# Patient Record
Sex: Male | Born: 1985 | Race: White | Hispanic: No | Marital: Married | Smoking: Never smoker
Health system: Southern US, Community
[De-identification: ages and names within clinical notes are randomized; demographics above are authoritative.]

## PROBLEM LIST (undated history)

## (undated) DIAGNOSIS — E785 Hyperlipidemia, unspecified: Secondary | ICD-10-CM

## (undated) HISTORY — PX: ROTATOR CUFF REPAIR: SHX139

## (undated) HISTORY — PX: CLAVICLE SURGERY: SHX598

## (undated) HISTORY — DX: Hyperlipidemia, unspecified: E78.5

---
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
## 2004-04-22 ENCOUNTER — Emergency Department (HOSPITAL_COMMUNITY): Admission: EM | Admit: 2004-04-22 | Discharge: 2004-04-23 | Payer: Self-pay | Admitting: Emergency Medicine

## 2004-05-13 ENCOUNTER — Emergency Department (HOSPITAL_COMMUNITY): Admission: EM | Admit: 2004-05-13 | Discharge: 2004-05-13 | Payer: Self-pay | Admitting: Emergency Medicine

## 2004-06-24 ENCOUNTER — Ambulatory Visit (HOSPITAL_BASED_OUTPATIENT_CLINIC_OR_DEPARTMENT_OTHER): Admission: RE | Admit: 2004-06-24 | Discharge: 2004-06-24 | Payer: Self-pay | Admitting: Orthopedic Surgery

## 2005-11-22 IMAGING — CR DG SHOULDER 2+V*R*
2 series · 2 of 2 positions shown · non-contrast
Comparison: none

CLINICAL DATA: Right shoulder pain.  Swelling. 
 RIGHT SHOULDER - 2 VIEW:
 Anteroinferior subcoracoid shoulder dislocation noted.  Suggestion of mild Hill-Sachs deformity.

[view not recorded (1 of 2)]
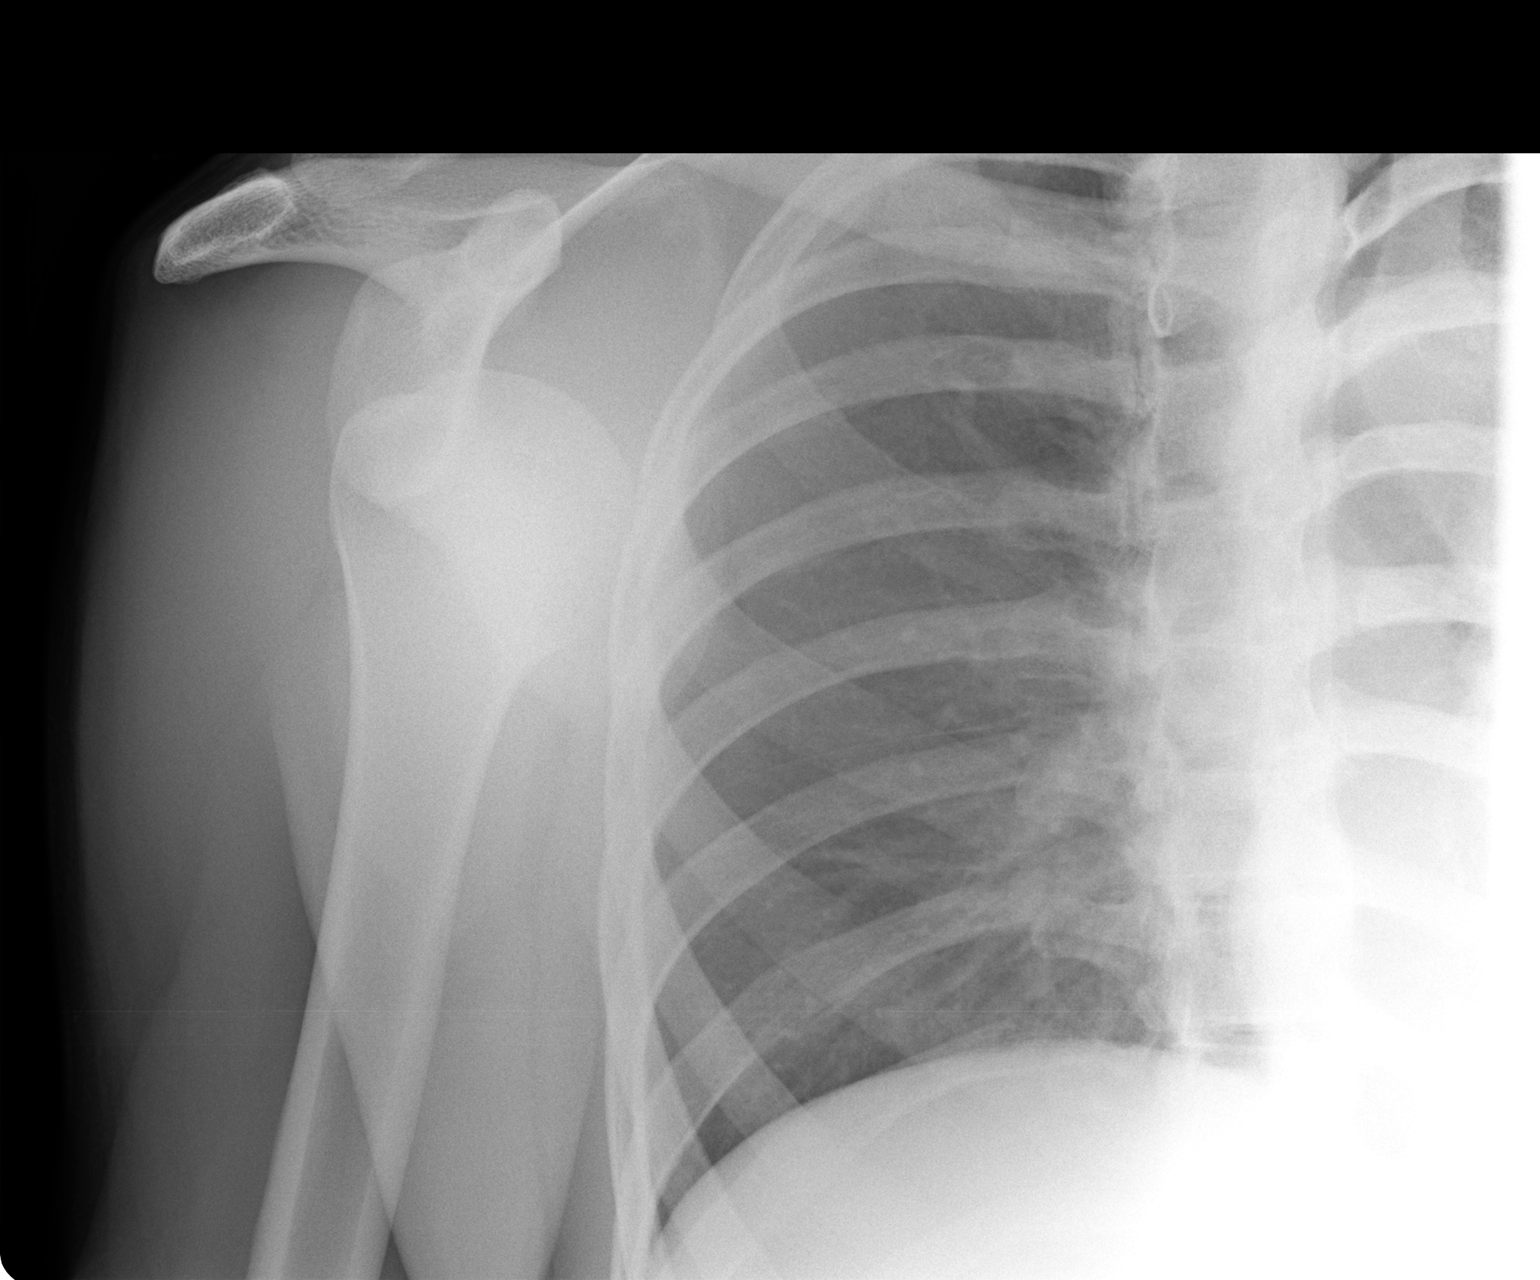

[view not recorded (2 of 2)]
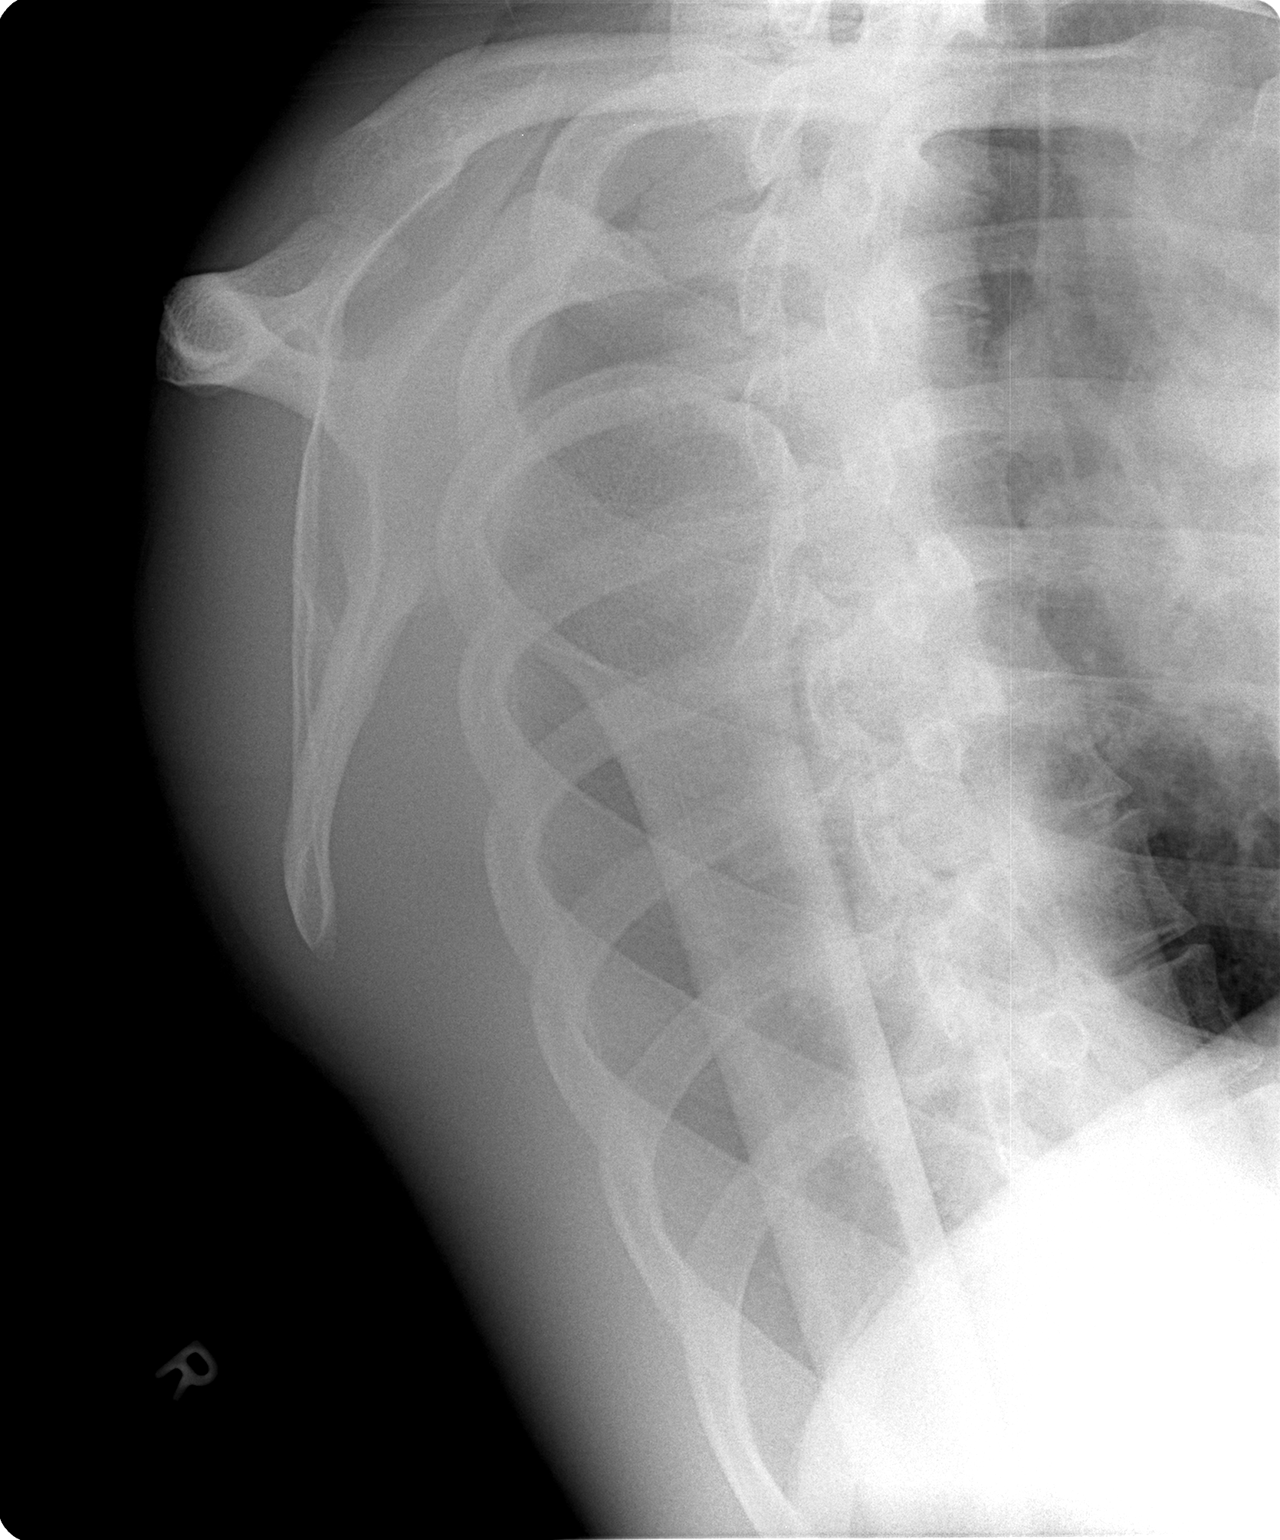

[2 of 2 positions shown; findings below may reference images not displayed]

IMPRESSION: Anteroinferior dislocation of the shoulder with suggestion of mild Hill-Sachs deformity.

## 2008-07-27 ENCOUNTER — Emergency Department (HOSPITAL_COMMUNITY): Admission: EM | Admit: 2008-07-27 | Discharge: 2008-07-27 | Payer: Self-pay | Admitting: Emergency Medicine

## 2008-07-31 ENCOUNTER — Ambulatory Visit (HOSPITAL_BASED_OUTPATIENT_CLINIC_OR_DEPARTMENT_OTHER): Admission: RE | Admit: 2008-07-31 | Discharge: 2008-08-01 | Payer: Self-pay | Admitting: Orthopedic Surgery

## 2010-02-05 IMAGING — CR DG CLAVICLE*R*
2 series · 2 of 2 positions shown · non-contrast
Comparison: 05/13/2004

CLINICAL DATA: Fell playing soccer

RIGHT CLAVICLE - 2+ VIEWS

[w clavicle ap right]
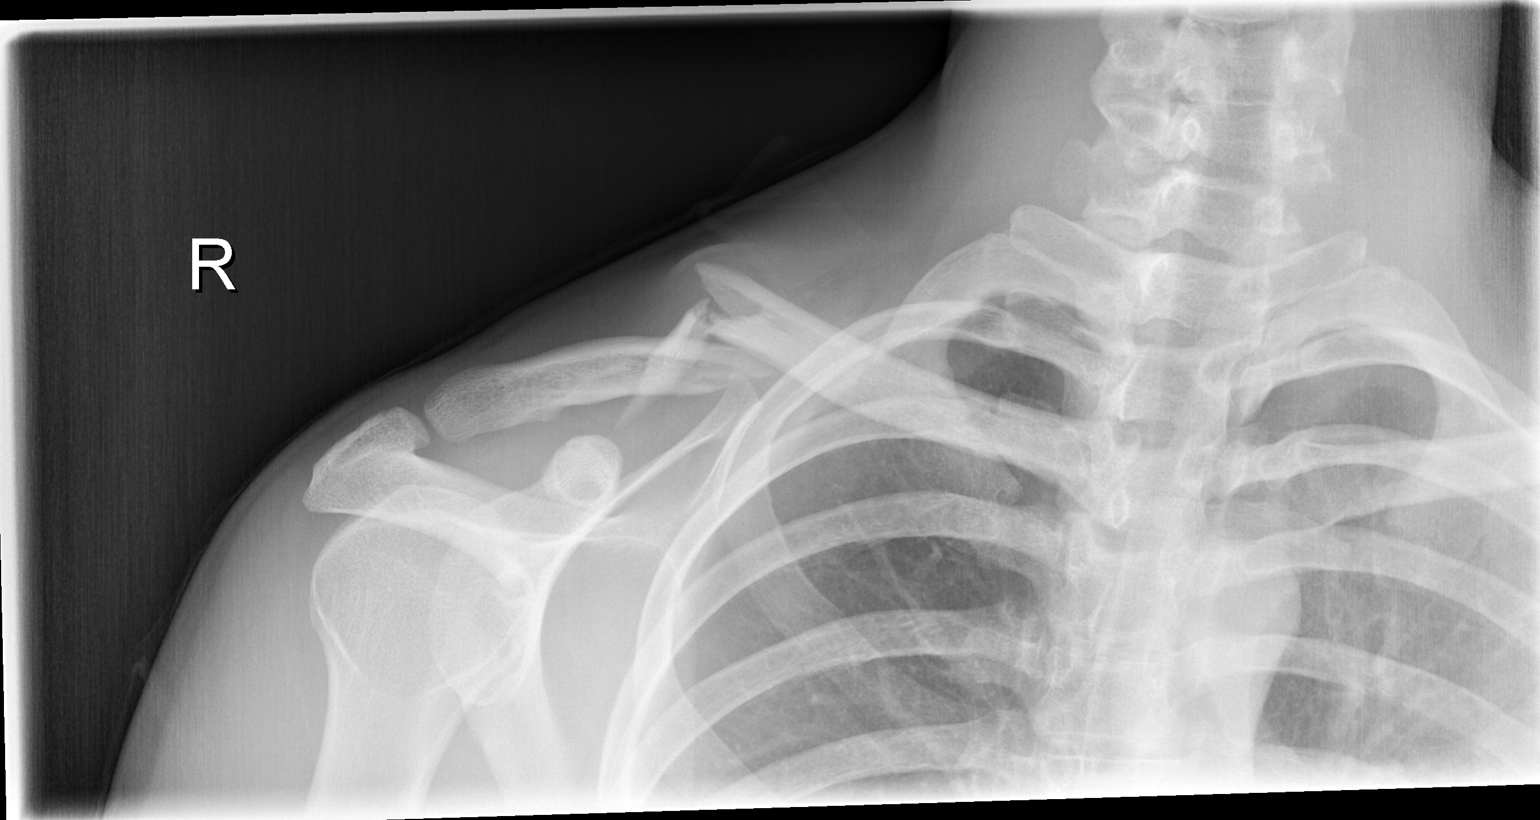

[w clavicle tangential right]
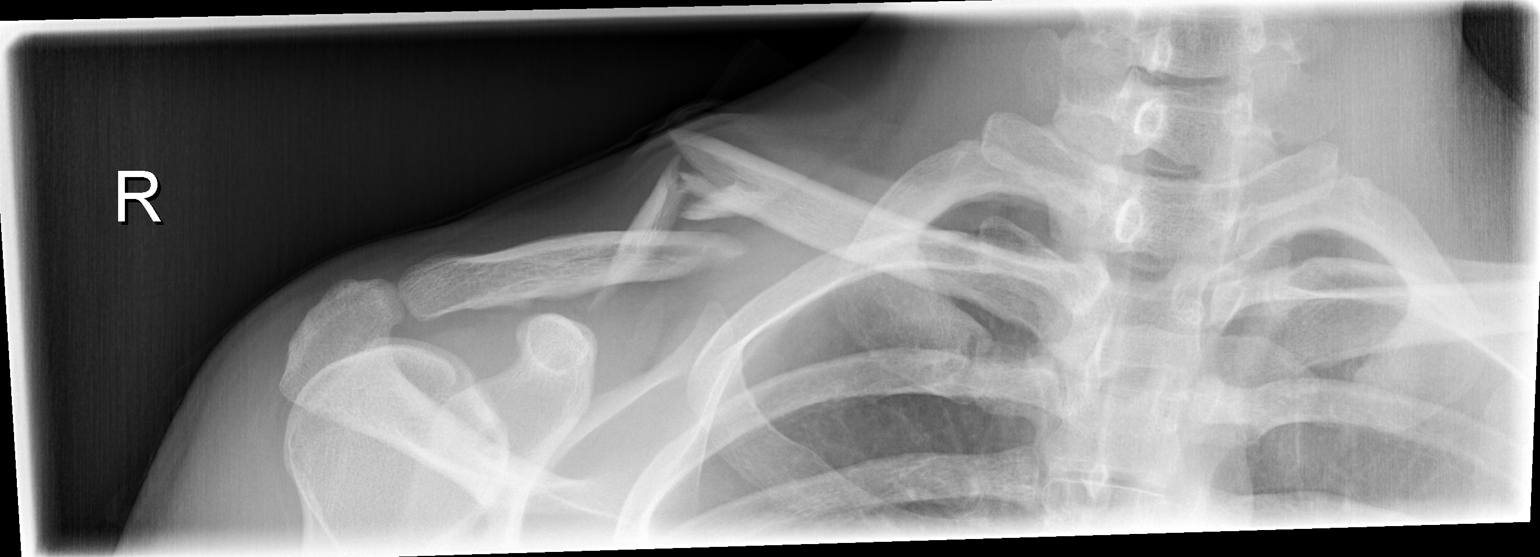

[2 of 2 positions shown; findings below may reference images not displayed]

FINDINGS: Comminuted midshaft clavicle fracture with soft tissue
swelling.  There is deformity of the supraclavicular fossa.  No
adjacent rib fractures are seen.
IMPRESSION: Comminuted midshaft clavicle fracture, right

## 2010-07-21 LAB — POCT HEMOGLOBIN-HEMACUE: Hemoglobin: 14.6 g/dL (ref 13.0–17.0)

## 2010-08-24 NOTE — Op Note (Signed)
NAME:  Daniel Gilbert, Daniel Gilbert NO.:  [id number]   MEDICAL RECORD NO.:  [id number]          PATIENT TYPE:  AMB   LOCATION:  DSC                          FACILITY:  [facility]   PHYSICIAN:  Loreta Ave, M.D. DATE OF BIRTH:  03-10-1986   DATE OF PROCEDURE:  07/31/2008  DATE OF DISCHARGE:  08/01/2008                               OPERATIVE REPORT   PREOPERATIVE DIAGNOSIS:  Closed comminuted four-part midshaft clavicle  fracture, right.   POSTOPERATIVE DIAGNOSIS:  Closed comminuted four-part midshaft clavicle  fracture, right.   PROCEDURE:  Open reduction and internal fixation, right clavicle  fracture with an anterior eight-hole Acumed plate and screws.   SURGEON:  Loreta Ave, MD   ASSISTANT:  Genene Churn. Barry Dienes, PA present throughout the entire case  necessary for timely completion of procedure.   ANESTHESIA:  General.   ESTIMATED BLOOD LOSS:  Minimal.   SPECIMENS:  None.   CULTURES:  None.   COMPLICATIONS:  None.   DRESSINGS:  Soft compressive with sling.   PROCEDURE:  The patient brought to the operating room and after adequate  anesthesia had been obtained placed in beach-chair position on the  shoulder positioner, prepped and draped in usual sterile fashion.  Clavicle approach with a longitudinal incision slightly anterior to the  top of the clavicle.  Skin and subcutaneous tissue divided.  The  fracture on the medial side had already pulled through the deltopectoral  fascia.  Deltopectoral fascia was taken down for exposure of the  fracture trying to maintain as much of blood supply to the clavicle as  possible.  Two major ports, medial and lateral and then 2 butterfly  fragments in the middle that returned 90 degrees.  One small the other  quite large making it more than half the shaft.  Able to bring the  clavicle out to length, piece the butterfly fragments in.  With  appropriate clamps and preshaping anterior eight-hole plate, it was  contoured to  the front of the clavicle holding the clavicle in a reduced  anatomic position.  It was then solidly fixed with predrilling and  placement of screws through the plate including to the central screws  that were placed as lag screws like the butterfly fragment and played  down to the back of the shaft.  At completion, nice solid stable  fixation with anatomic alignment confirmed visually and  fluoroscopically.  Wound irrigated.  Deltopectoral fascia closed with  Vicryl back over top of  the fracture.  Skin closed with Vicryl and staples.  Margins were  injected with Marcaine.  Sterile compressive dressing applied.  Sling  applied.  Anesthesia reversed.  Brought to recovery room.  Tolerated  surgery well.  No complications.      Loreta Ave, M.D.  Electronically Signed     DFM/MEDQ  D:  08/01/2008  T:  08/01/2008  Job:  [id number]

## 2010-08-27 NOTE — Op Note (Signed)
NAME:  Daniel Gilbert, CHESTNUTT NO.:  [id number]   MEDICAL RECORD NO.:  [id number]          PATIENT TYPE:  AMB   LOCATION:  DSC                          FACILITY:  [facility]   PHYSICIAN:  Loreta Ave, M.D. 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   POSTOPERATIVE DIAGNOSES:  1.  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  2.  Bankart lesion as well as Hill-Sachs lesion and chondral loose bodies.   PROCEDURE:  1.  Right shoulder exam under anesthesia.  2.  Arthroscopy with debridement glenohumeral joint including removal of      chondral loose bodies.  3.  Anterior reconstruction, Bankart type, utilizing Arthrex anchors times      three and FiberWire suture, arthroscopically assisted.   SURGEON:  Loreta Ave, M.D.   ASSISTANT:  Zonia Kief, P.A.   ANESTHESIA:  General.   BLOOD LOSS:  Minimal.   SPECIMENS:  None.   CULTURES:  None.   COMPLICATIONS:  None.   DRESSING:  Soft compression with shoulder immobilizer.   PROCEDURE:  The patient brought to the operating room, placed on the  operating table in the supine position.  After adequate anesthesia had been  obtained, both shoulders were examined.  Evidence of anterior instability  right shoulder but no global laxity.  Stable inferiorly and posteriorly.  Full motion.  Placed in a beach chair position and the shoulder positioned,  prepped and draped in the usual sterile fashion.  Two portals, one anterior  and one posterior.  Shoulder joint entered with the scope,distended and  inspected.  Large Hill-Sachs lesion back in the humeral head with chondral  loose bodies, all of this debrided.  Bankart lesion from the 1 o'clock to  the 6  o'clock position with marked tearing away of the capsule and labrum  and the Bankart lesion extending a good ways down the front of the scapula.  Biceps tendon, biceps anchor, rotator cuff, posterior capsule all otherwise  intact.  The front of the glenoid was roughened.  A second larger anterior  portal created and two cannulas placed in the front of the shoulder for  reconstruction.  After roughening the front of the glenoid, a series of  three drill holes were made at the 1, 3 and 5 o'clock position for anchors.  Utilizing the lasso apparatus, FiberWire sutures were placed into the  capsule at the capsular labral interface starting inferior and extending  superiorly.  Utilizing those sutures which were brought out the inferior  cannula, anchors were attached to the sutures and then firmly hammered down  into the series of drill holes, snugging the capsule up against the front of  the glenoid.  At completion all 3 anchors were solidly placed with the  sutures through the capsule brought down through the anchors and locking  them in place.  The sutures were  cut off as they exited out through the  anchor drill hole.  At completion, I had solid stable fixation of the  capsule up against the front of the glenoid, obliterating the Bankart lesion  and restoring anterior stability.  Still maintained good passive motion.  The entire shoulder examined arthroscopically, no other findings  appreciated.  Instruments and fluid removed.  The portals and shoulder  injected with Marcaine.  The portals were closed with 4-0 nylon.  A sterile  compressive dressing applied.  Shoulder immobilizer applied.  Anesthesia  reversed.  Brought to recovery room.  Tolerated surgery well with no  complications.      DFM/MEDQ  D:  06/24/2004  T:  06/24/2004  Job:  [id number]

## 2014-03-07 DIAGNOSIS — E782 Mixed hyperlipidemia: Secondary | ICD-10-CM | POA: Insufficient documentation

## 2014-03-07 DIAGNOSIS — E785 Hyperlipidemia, unspecified: Secondary | ICD-10-CM | POA: Insufficient documentation

## 2017-03-15 ENCOUNTER — Ambulatory Visit: Payer: 59 | Admitting: Family Medicine

## 2017-03-15 ENCOUNTER — Encounter: Payer: Self-pay | Admitting: Family Medicine

## 2017-03-15 VITALS — BP 130/88 | HR 66 | Ht 70.5 in | Wt 181.6 lb

## 2017-03-15 DIAGNOSIS — Z23 Encounter for immunization: Secondary | ICD-10-CM

## 2017-03-15 DIAGNOSIS — J029 Acute pharyngitis, unspecified: Secondary | ICD-10-CM | POA: Diagnosis not present

## 2017-03-15 MED ORDER — IPRATROPIUM BROMIDE 0.06 % NA SOLN: 2.0000 | Freq: Four times a day (QID) | NASAL | 0 refills | Status: DC

## 2017-03-15 MED ORDER — PANTOPRAZOLE SODIUM 40 MG PO TBEC: 40.0000 mg | DELAYED_RELEASE_TABLET | Freq: Every day | ORAL | 3 refills | Status: DC

## 2017-03-15 NOTE — Progress Notes (Signed)
Subjective:  Daniel SickleRobert Gilbert is a 31 y.o. male who presents today with a chief complaint of sore throat and to establish care.   HPI:  Sore Throat, New Problem Started about a month ago. Stable over that time.  Initially had URI symptoms including rhinorrhea, sinus pressure, and cough.  He was treated through an E visit with a course of amoxicillin.  Thinks that this may have helped a little bit.  Symptoms worsen throughout the day.  Pain is mostly in the left side of his throat described as a sharp pain.  No difficulty swallowing.  Occasional sinus pressure.  Occasional ear pain.  Still is having some postnasal drip.  No other treatments tried.  Has been around several sick contacts with URI symptoms over the past month.  Occasionally has reflux symptoms depending on what he eats.  No nausea or vomiting.  No fevers or chills.  ROS: Per HPI, otherwise a 14 point review of systems was performed and was negative  PMH:  The following were reviewed and entered/updated in epic: Past Medical History:  Diagnosis Date  . Hyperlipidemia    Patient Active Problem List   Diagnosis Date Noted  . 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 CLAVICLE SURGERY Right   . ROTATOR CUFF REPAIR Right     Family History  Problem Relation Age of Onset  . Depression Mother   . Heart attack Maternal Grandfather   . Hyperlipidemia Maternal Grandfather   . Stroke Maternal Grandfather   . Alcohol abuse Paternal Grandfather   . Cancer Paternal Grandfather     Medications- reviewed and updated Current Outpatient Medications  Medication Sig Dispense Refill  . ipratropium (ATROVENT) 0.06 % nasal spray Place 2 sprays into both nostrils 4 (four) times daily. 15 mL 0  . pantoprazole (PROTONIX) 40 MG tablet Take 1 tablet (40 mg total) by mouth daily. 30 tablet 3   No current facility-administered medications for this visit.    Allergies-reviewed and  updated No Known Allergies  Social History   Socioeconomic History  . Marital status: Married    Spouse name: None  . Number of children: 1  . Years of education: None  . Highest education level: None  Social Needs  . Financial resource strain: None  . Food insecurity - worry: None  . Food insecurity - inability: None  . Transportation needs - medical: None  . Transportation needs - non-medical: None  Occupational History  . Occupation: Production designer, theatre/television/filmManager  Tobacco Use  . Smoking status: Never Smoker  . Smokeless tobacco: Never Used  Substance and Sexual Activity  . Alcohol use: Yes  . Drug use: No  . Sexual activity: Yes  Other Topics Concern  . None  Social History Narrative  . None   Objective:  Physical Exam: BP 130/88   Pulse 66   Ht 5' 10.5" (1.791 m)   Wt 181 lb 9.6 oz (82.4 kg)   SpO2 100%   BMI 25.69 kg/m   Gen: NAD, resting comfortably HEENT: Right TM with clear effusion.  Left TM clear.  Nasal mucosa erythematous with clear nasal discharge.  Left maxillary sinus with mildly decreased transmission.  Right maxillary sinus with mildly decreased translocation.  Oropharynx erythematous without exudate.  No lymphadenopathy noted. CV: RRR with no murmurs appreciated Pulm: NWOB, CTAB with no crackles, wheezes, or rhonchi GI: Normal bowel sounds present. Soft, Nontender, Nondistended. MSK: No edema, cyanosis, or clubbing noted Skin: Warm,  dry Neuro: Grossly normal, moves all extremities Psych: Normal affect and thought content  Assessment/Plan:  Sore throat Given his findings suggestive of postnasal drip/nasal congestion, we will start Atrovent nasal spray as well as a oral second-generation antihistamine.  He does not have any signs or symptoms consistent with bacterial sinus infection-this would have additionally been treated with his course of amoxicillin.  He is having some reflux symptoms-this may be contributing as well.  We will send in a prescription for  Protonix-advised patient to try the above treatment for 1-2 weeks and start Protonix if symptoms not improving.  If symptoms persist despite treatment for GERD and postnasal drip, will need referral to ENT.  Preventive healthcare Flu shot given today.  He will return soon for his CPE.  Katina Degreealeb M. Jimmey RalphParker, MD 03/15/2017 9:36 AM

## 2017-03-15 NOTE — Patient Instructions (Signed)
Start Claritin or Zyrtec.  Also start Atrovent nasal spray.  If your symptoms are not improving in a couple weeks, try the Protonix.  If still no improvement please let us know.  We will need to send you to an ear nose and throat doctor.  Come back soon to recheck your cholesterol.  Take care, Dr. Jimmey RalphParker

## 2017-03-28 ENCOUNTER — Telehealth: Payer: Self-pay | Admitting: Family Medicine

## 2017-03-28 NOTE — Telephone Encounter (Signed)
Please advise on the message below regarding clarity about a possible appointment.  Copied from CRM [id number]. Topic: Inquiry >> Mar 28, 2017 12:05 PM Daniel BergeronBarksdale, Harvey B wrote: Reason for CRM: pt called about his throat and is asking about if he needs to come back in or if he needs to see a throat specialist, contact pt to advise

## 2017-03-29 ENCOUNTER — Other Ambulatory Visit: Payer: Self-pay

## 2017-03-29 DIAGNOSIS — J312 Chronic pharyngitis: Secondary | ICD-10-CM

## 2017-03-29 NOTE — Telephone Encounter (Signed)
Please place referral to ENT for chronic sore throat.  Daniel Degreealeb M. Jimmey RalphParker, MD 03/29/2017 8:16 AM

## 2017-03-29 NOTE — Telephone Encounter (Signed)
Notified patient that referral to ENT has been placed.  Patient verbalized understanding.

## 2017-03-29 NOTE — Telephone Encounter (Signed)
Please advise.  Symptoms have not improved.

## 2017-03-29 NOTE — Telephone Encounter (Signed)
Referral placed.

## 2017-03-30 ENCOUNTER — Encounter: Payer: Self-pay | Admitting: Family Medicine

## 2017-03-30 ENCOUNTER — Ambulatory Visit: Payer: 59 | Admitting: Family Medicine

## 2017-03-30 VITALS — BP 112/72 | HR 63 | Temp 98.7°F | Ht 70.5 in | Wt 187.6 lb

## 2017-03-30 DIAGNOSIS — M10071 Idiopathic gout, right ankle and foot: Secondary | ICD-10-CM

## 2017-03-30 MED ORDER — COLCHICINE 0.6 MG PO TABS: ORAL_TABLET | ORAL | 0 refills | Status: DC

## 2017-03-30 NOTE — Progress Notes (Signed)
Subjective:  Daniel Gilbert is a 31 y.o. year old very pleasant male patient who presents for/with See problem oriented charting ROS- no fever, chills, nausea, vomiting. No expanding redness around the ankle.    Past Medical History-  Patient Active Problem List   Diagnosis Date Noted  . 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 ipratropium (ATROVENT) 0.06 % nasal spray Place 2 sprays into both nostrils 4 (four) times daily. 15 mL 0  . pantoprazole (PROTONIX) 40 MG tablet Take 1 tablet (40 mg total) by mouth daily. 30 tablet 3   Objective: BP 112/72 (BP Location: Left Arm, Patient Position: Sitting, Cuff Size: Large)   Pulse 63   Temp 98.7 F (37.1 C) (Oral)   Ht 5' 10.5" (1.791 m)   Wt 187 lb 9.6 oz (85.1 kg)   SpO2 97%   BMI 26.54 kg/m  Gen: NAD, resting comfortably CV: RRR  Lungs: nonlabored Ext: no edema Skin: warm, dry, no rash other than mild erythema over lateral ankle MSK: able to stand on toes on unilateral right and left foot though with some pain. Normal motion of foot with thompson squeeze test. No pain over medial malleolous on right foot. Lateral malleolus swollen, warm, tender to touch. Also mild pain with palpation over achilles but with pronation reports severe pain at base of achilles.    Assessment/Plan:  Acute idiopathic gout of right ankle S: Right ankle pain for several days. Started Tuesday around 4-5 PM noted some mild achilles pain on right foot and some on left foot (much less). By after dinner didn't feel like he could walk due to the pain- poor sleep Tuesday and Wednesday night- almost went to emergency room last night. Even sheet touching his foot hurt. Dad and brothers all have had gout. Felt like ankle was on fire and someone was sawing it in half - pain over lateral malleolus but most  Severe pain over achilles. At least today he can walk but last  night couldn't stand on it.   Rolled ankle playing soccer last Thursday. Was able to run over the weekend and didn't have any pain.   He states since sore throat and potential GERD earlier this month- Stopped drinking, cut down on coffee, eating better. He has tried to avoid nsaids but had to use ibuprofen 400mg  every 2-3 hours to avoid going to emergency room the other night A/P: Highly suspicious of gout. Discussed with Dr. Berline Choughigby and with severity of pain we offered patient injection today. He declined and preferred to try orals. Colchicine sent in, could use prednisone if cost/insurance issues. Patient agrees to return if not improving to consider injection. We did not do a uric acid level but he will need that at future visit with Dr. Jimmey RalphParker perhaps at physical.   Meds ordered this encounter  Medications  . colchicine 0.6 MG tablet    Sig: Take 2 tablets immediately. Repeat 1 tablet in an hour if pain persists. Then take daily until pain resolves    Dispense:  15 tablet    Refill:  0    May substitute colcrys or mitigare- per patient's insurance    Return precautions advised.  Tana ConchStephen Zoltan Genest, MD

## 2017-03-30 NOTE — Patient Instructions (Addendum)
Agree with you- this looks like gout  Take colchicine  If not improving by Monday return to see us. If pain worsens despite treatment- lets try to get you in with Dr. Berline Choughigby tomorrow to consider injection

## 2017-04-17 ENCOUNTER — Ambulatory Visit: Payer: 59 | Admitting: Family Medicine

## 2017-04-17 ENCOUNTER — Encounter: Payer: Self-pay | Admitting: Family Medicine

## 2017-04-17 ENCOUNTER — Ambulatory Visit: Payer: Self-pay

## 2017-04-17 ENCOUNTER — Ambulatory Visit: Payer: 59 | Admitting: Sports Medicine

## 2017-04-17 VITALS — BP 128/78 | HR 88 | Temp 98.8°F | Ht 70.5 in | Wt 189.6 lb

## 2017-04-17 DIAGNOSIS — M10071 Idiopathic gout, right ankle and foot: Secondary | ICD-10-CM | POA: Diagnosis not present

## 2017-04-17 DIAGNOSIS — M25571 Pain in right ankle and joints of right foot: Secondary | ICD-10-CM

## 2017-04-17 MED ORDER — INDOMETHACIN 50 MG PO CAPS: 50.0000 mg | ORAL_CAPSULE | Freq: Two times a day (BID) | ORAL | 0 refills | Status: DC

## 2017-04-17 NOTE — Procedures (Signed)
PROCEDURE NOTE -  ULTRASOUND GUIDEDInjection: Right ankle joint Images were obtained and interpreted by myself, Gaspar BiddingMichael Caitlain Tweed, DO  Images have been saved and stored to PACS system. Images obtained on: GE S7 Ultrasound machine  ULTRASOUND FINDINGS:  Significant posterior medial swelling along the medial joint line with a slightly pedunculated ankle effusion tracking from the posterior medial joint line past the posterior tibialis tendon.  There is no significant posterior tibialis tenosynovitis  DESCRIPTION OF PROCEDURE:  The patient's clinical condition is marked by substantial pain and/or significant functional disability. Other conservative therapy has not provided relief, is contraindicated, or not appropriate. There is a reasonable likelihood that injection will significantly improve the patient's pain and/or functional impairment.  After discussing the risks, benefits and expected outcomes of the injection and all questions were reviewed and answered, the patient wished to undergo the above named procedure. Verbal consent was obtained.  The ultrasound was used to identify the target structure and adjacent neurovascular structures. The skin was then prepped in sterile fashion and the target structure was injected under direct visualization using sterile technique as below:  Right PREP: Alcohol, Ethel Chloride,  APPROACH: direct, single injection, 25g 1.5in. INJECTATE: 0.5cc 1% lidocaine, 2cc 0.5% marcaine, 2cc 80mg /mL DepoMedrol  ASPIRATE: N/A DRESSING: Band-Aid and full ankle Body Helix    Post procedural instructions including recommending icing and warning signs for infection were reviewed.  This procedure was well tolerated and there were no complications.   IMPRESSION: Succesful US Guided Injection

## 2017-04-17 NOTE — Progress Notes (Signed)
    Subjective:  Daniel KayRobert Charles Gilbert is a 32 y.o. male who presents today with a chief complaint of right ankle pain.   HPI:  Right Ankle Pain, Established problem, worsening Patient seen 2.5 weeks ago for this. Was prescribed colchicine which helped some however never completely resolved. He was offered a steroid injection, however deferred. Symptoms are now worse than his last visit. No weakness or numbness. No redness. Pain mostly located along posterior aspect.   ROS: Per HPI  Objective:  Physical Exam: Ht 5' 10.5" (1.791 m)   Wt 189 lb 9.6 oz (86 kg)   BMI 26.82 kg/m   Gen: NAD, resting comfortably MSK: Right foot: Mild erythema and swelling to posterior lateral malleolus. Tender to palpation along posterior lateral malleolus. Strength 5/5 in all directions. Negative anterior drawer. Negative talar tilt.   Assessment/Plan:  Right Ankle Pain / Gout Flare Patient will have intra-articular injection done by Dr. Berline Choughigby today-please see his additional documentation.  We will also send in indomethacin for patient.  Consider uric acid level with next blood draw-advised patient that we will have to wait until he is no longer having an acute flare to get an accurate reading.  Return precautions reviewed.  Follow-up with myself or sports medicine as needed.  Katina Degreealeb M. Jimmey RalphParker, MD 04/17/2017 1:09 PM

## 2017-04-17 NOTE — Progress Notes (Signed)
Patient was requesting an injection today for the persistent ongoing posterior ankle pain.  Ankle was injected per procedure note.  He did appear to have a ankle effusion that it actually caused a small collection of fluid in the posterior ankle that was visualized with ultrasound and directly injected today.  Risks and benefits of the injection were discussed prior to this and we placed him into a Body Helix full ankle compression sleeve.  I will plan to follow-up with him in 6 weeks to ensure clinical resolution and if any lack of improvement over the next several weeks further diagnostic evaluation with MRI of the ankle could be considered.

## 2017-04-17 NOTE — Patient Instructions (Addendum)
We will inject your ankle today.  We can check a "gout level" next time we get blood work.  Take care, Dr Elroy ChannelParker   You had an injection today.  Things to be aware of after injection are listed below: . You may experience no significant improvement or even a slight worsening in your symptoms during the first 24 to 48 hours.  After that we expect your symptoms to improve gradually over the next 2 weeks for the medicine to have its maximal effect.  You should continue to have improvement out to 6 weeks after your injection. . Dr. Berline Choughigby recommends icing the site of the injection for 20 minutes  1-2 times the day of your injection . You may shower but no swimming, tub bath or Jacuzzi for 24 hours. . If your bandage falls off this does not need to be replaced.  It is appropriate to remove the bandage after 4 hours. . You may resume light activities as tolerated unless otherwise directed per Dr. Berline Choughigby during your visit  POSSIBLE STEROID SIDE EFFECTS:  Side effects from injectable steroids tend to be less than when taken orally however you may experience some of the symptoms listed below.  If experienced these should only last for a short period of time. Change in menstrual flow  Edema (swelling)  Increased appetite Skin flushing (redness)  Skin rash/acne  Thrush (oral) Yeast vaginitis    Increased sweating  Depression Increased blood glucose levels Cramping and leg/calf  Euphoria (feeling happy)  POSSIBLE PROCEDURE SIDE EFFECTS: The side effects of the injection are usually fairly minimal however if you may experience some of the following side effects that are usually self-limited and will is off on their own.  If you are concerned please feel free to call the office with questions:  Increased numbness or tingling  Nausea or vomiting  Swelling or bruising at the injection site   Please call our office if if you experience any of the following symptoms over the next week as these can be signs of  infection:   Fever greater than 100.59F  Significant swelling at the injection site  Significant redness or drainage from the injection site  If after 2 weeks you are continuing to have worsening symptoms please call our office to discuss what the next appropriate actions should be including the potential for a return office visit or other diagnostic testing.

## 2017-05-29 ENCOUNTER — Ambulatory Visit: Payer: 59 | Admitting: Sports Medicine

## 2017-05-29 ENCOUNTER — Ambulatory Visit: Payer: Self-pay

## 2017-05-29 ENCOUNTER — Encounter: Payer: Self-pay | Admitting: Sports Medicine

## 2017-05-29 VITALS — BP 122/84 | HR 64 | Ht 70.5 in | Wt 186.6 lb

## 2017-05-29 DIAGNOSIS — M7741 Metatarsalgia, right foot: Secondary | ICD-10-CM

## 2017-05-29 DIAGNOSIS — R269 Unspecified abnormalities of gait and mobility: Secondary | ICD-10-CM | POA: Diagnosis not present

## 2017-05-29 DIAGNOSIS — M10071 Idiopathic gout, right ankle and foot: Secondary | ICD-10-CM

## 2017-05-29 DIAGNOSIS — M7742 Metatarsalgia, left foot: Secondary | ICD-10-CM

## 2017-05-29 DIAGNOSIS — M25571 Pain in right ankle and joints of right foot: Secondary | ICD-10-CM | POA: Diagnosis not present

## 2017-05-29 DIAGNOSIS — M109 Gout, unspecified: Secondary | ICD-10-CM | POA: Insufficient documentation

## 2017-05-29 NOTE — Progress Notes (Signed)
Daniel Gilbert  Daniel Gilbert [phone]  Daniel KayRobert Charles Gilbert - 32 y.o. male MRN [id number]  Date of birth: 09/15/1985  Visit Date: 05/29/2017  PCP: Daniel Gilbert   Referred by: Daniel Gilbert   Scribe for today's visit: Daniel Gilbert     SUBJECTIVE:  Daniel Gilbert is here for Follow-up (RT ankle pain) .  Referred by: Daniel Gilbert Compared to the last office visit, his previously described symptoms are improving, he is concerned that he still has pain around the achilles when pointing his toes. He also have pain the grabbing his toes to stretch. He is able to run without trouble now.  Current symptoms are mild & are radiating to the achilles.  He had steroid injection 04/17/17. He was taking Colchicine for gout but that has been d/c. He was provided with Body Helix compression sleeve at last visit.    ROS Denies night time disturbances. Denies fevers, chills, or night sweats. Denies unexplained weight loss. Denies personal history of cancer. Denies changes in bowel or bladder habits. Denies recent unreported falls. Denies new or worsening dyspnea or wheezing. Denies headaches or dizziness.  Denies numbness, tingling or weakness  In the extremities.  Denies dizziness or presyncopal episodes Denies lower extremity edema     HISTORY & PERTINENT PRIOR DATA:  Prior History reviewed and updated per electronic medical record.  Significant history, findings, studies and interim changes include:  reports that  has never smoked. he has never used smokeless tobacco. No results for input(s): HGBA1C, LABURIC, CREATINE in the last 8760 hours. No specialty comments available. Problem  Acute Idiopathic Gout of Right Ankle  Acute Right Ankle Pain    OBJECTIVE:  VS:  HT:5' 10.5" (179.1 cm)   WT:186 lb 9.6 oz (84.6 kg)  BMI:26.39    BP:122/84  HR:64bpm  TEMP: ( )  RESP:98 %   PHYSICAL  EXAM: Constitutional: WDWN, Non-toxic appearing. Psychiatric: Alert & appropriately interactive.  Not depressed or anxious appearing. Respiratory: No increased work of breathing.  Trachea Midline Eyes: Pupils are equal.  EOM intact without nystagmus.  No scleral icterus  NEUROVASCULAR exam: No clubbing or cyanosis appreciated No significant venous stasis changes Capillary Refill: normal, less than 2 seconds    Bilateral feet and ankles: Overall well aligned.  He has a thin narrow foot with positive Morton's foot bilaterally.  Early Morton's callus on the right.  He has in curling of the right small toe.   Small amount of pain with calcaneal squeeze but this is minimal.  Ankle drawer testing is stable.  He has a slight equinus contracture to 95 degrees on the right and 100 degrees on the left.  Otherwise ankle intrinsic strength is normal.  No additional findings.   ASSESSMENT & PLAN:   1. Acute right ankle pain   2. Acute idiopathic gout of right ankle   3. Metatarsalgia of both feet   4. Gait disturbance    PLAN:    Acute right ankle pain Previous ankle pain has significantly improved he does continue to have some discomfort.  I suspect he does have an underlying equinus contracture and given the pronation that he has with ambulation would benefit from a custom cushioned insole given that he has responded previously to custom insoles but has not been wearing them given that they are worn out.    Medium longitudinal metatarsal pads added to her shoes today  with overall good improvement in his symptoms but incomplete relief of his discomfort.  Should continue with stretching and Daniel Gilbert exercises and we will review these when he comes in for his orthotic visit.  Lab work at orthotic visit as well.   ++++++++++++++++++++++++++++++++++++++++++++ Orders & Meds: Orders Placed This Encounter  Procedures  . CBC with Differential/Platelet  . Comprehensive metabolic panel  . Uric  acid    No orders of the defined types were placed in this encounter.   ++++++++++++++++++++++++++++++++++++++++++++ Follow-up: Return in about 2 weeks (around 06/12/2017).   Pertinent documentation may be included in additional procedure notes, imaging studies, problem based documentation and patient instructions. Please see these sections of the encounter for additional information regarding this visit. Gilbert/ATC served as Neurosurgeon during this visit. History, Physical, and Plan performed by medical provider. Documentation and orders reviewed and attested to.      [name], Gilbert    [facility] Physician

## 2017-05-29 NOTE — Assessment & Plan Note (Signed)
Previous ankle pain has significantly improved he does continue to have some discomfort.  I suspect he does have an underlying equinus contracture and given the pronation that he has with ambulation would benefit from a custom cushioned insole given that he has responded previously to custom insoles but has not been wearing them given that they are worn out.    Medium longitudinal metatarsal pads added to her shoes today with overall good improvement in his symptoms but incomplete relief of his discomfort.  Should continue with stretching and Alfredson exercises and we will review these when he comes in for his orthotic visit.  Lab work at orthotic visit as well.

## 2017-06-19 ENCOUNTER — Encounter: Payer: Self-pay | Admitting: Family Medicine

## 2017-06-19 ENCOUNTER — Encounter: Payer: Self-pay | Admitting: Sports Medicine

## 2017-06-19 ENCOUNTER — Ambulatory Visit: Payer: 59 | Admitting: Sports Medicine

## 2017-06-19 ENCOUNTER — Ambulatory Visit (INDEPENDENT_AMBULATORY_CARE_PROVIDER_SITE_OTHER): Payer: 59 | Admitting: Family Medicine

## 2017-06-19 VITALS — BP 120/80 | HR 60

## 2017-06-19 VITALS — BP 124/78 | HR 66 | Temp 98.4°F | Ht 70.5 in | Wt 185.4 lb

## 2017-06-19 DIAGNOSIS — M10071 Idiopathic gout, right ankle and foot: Secondary | ICD-10-CM

## 2017-06-19 DIAGNOSIS — Z13220 Encounter for screening for lipoid disorders: Secondary | ICD-10-CM

## 2017-06-19 DIAGNOSIS — M109 Gout, unspecified: Secondary | ICD-10-CM | POA: Diagnosis not present

## 2017-06-19 DIAGNOSIS — R269 Unspecified abnormalities of gait and mobility: Secondary | ICD-10-CM

## 2017-06-19 DIAGNOSIS — M7741 Metatarsalgia, right foot: Secondary | ICD-10-CM

## 2017-06-19 DIAGNOSIS — M7742 Metatarsalgia, left foot: Secondary | ICD-10-CM | POA: Diagnosis not present

## 2017-06-19 DIAGNOSIS — Z0001 Encounter for general adult medical examination with abnormal findings: Secondary | ICD-10-CM | POA: Diagnosis not present

## 2017-06-19 DIAGNOSIS — M25571 Pain in right ankle and joints of right foot: Secondary | ICD-10-CM

## 2017-06-19 DIAGNOSIS — Z114 Encounter for screening for human immunodeficiency virus [HIV]: Secondary | ICD-10-CM

## 2017-06-19 LAB — LIPID PANEL
CHOL/HDL RATIO: 4
Cholesterol: 233 mg/dL — ABNORMAL HIGH (ref 0–200)
HDL: 55.7 mg/dL (ref >39.00)
LDL Cholesterol: 148 mg/dL — ABNORMAL HIGH (ref 0–99)
NONHDL: 177.15
TRIGLYCERIDES: 146 mg/dL (ref 0.0–149.0)
VLDL: 29.2 mg/dL (ref 0.0–40.0)

## 2017-06-19 NOTE — Progress Notes (Signed)
Subjective:  Daniel Gilbert is a 32 y.o. male who presents today for his annual comprehensive physical exam.    HPI:  He has no acute complaints today.   Lifestyle Diet: No specific diets. Exercise: Tries to work 3-4 times per week.   Depression screen PHQ 2/9 03/15/2017  Decreased Interest 0  Down, Depressed, Hopeless 0  PHQ - 2 Score 0   Health Maintenance Due  Topic Date Due  . HIV Screening  12/05/1999    ROS: A complete review of systems was negative.   PMH:  The following were reviewed and entered/updated in epic: Past Medical History:  Diagnosis Date  . Hyperlipidemia    Patient Active Problem List   Diagnosis Date Noted  . Gout of right ankle 05/29/2017  . Acute right ankle pain 04/17/2017  . 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 CLAVICLE SURGERY Right   . ROTATOR CUFF REPAIR Right    Family History  Problem Relation Age of Onset  . Depression Mother   . Heart attack Maternal Grandfather   . Hyperlipidemia Maternal Grandfather   . Stroke Maternal Grandfather   . Alcohol abuse Paternal Grandfather   . Cancer Paternal Grandfather     Medications- reviewed and updated No current outpatient medications on file.   No current facility-administered medications for this visit.     Allergies-reviewed and updated No Known Allergies  Social History   Socioeconomic History  . Marital status: Married    Spouse name: None  . Number of children: 1  . Years of education: None  . Highest education level: None  Social Needs  . Financial resource strain: None  . Food insecurity - worry: None  . Food insecurity - inability: None  . Transportation needs - medical: None  . Transportation needs - non-medical: None  Occupational History  . Occupation: Production designer, theatre/television/film  Tobacco Use  . Smoking status: Never Smoker  . Smokeless tobacco: Never Used  Substance and Sexual Activity  . Alcohol use: Yes  .  Drug use: No  . Sexual activity: Yes  Other Topics Concern  . None  Social History Narrative  . None    Objective:  Physical Exam: BP 124/78 (BP Location: Left Arm, Patient Position: Sitting, Cuff Size: Normal)   Pulse 66   Temp 98.4 F (36.9 C) (Oral)   Ht 5' 10.5" (1.791 m)   Wt 185 lb 6.4 oz (84.1 kg)   SpO2 98%   BMI 26.23 kg/m   Body mass index is 26.23 kg/m. Wt Readings from Last 3 Encounters:  06/19/17 185 lb 6.4 oz (84.1 kg)  05/29/17 186 lb 9.6 oz (84.6 kg)  04/17/17 189 lb 9.6 oz (86 kg)   Gen: NAD, resting comfortably HEENT: TMs normal bilaterally. OP clear. No thyromegaly noted.  CV: RRR with no murmurs appreciated Pulm: NWOB, CTAB with no crackles, wheezes, or rhonchi GI: Normal bowel sounds present. Soft, Nontender, Nondistended. MSK: no edema, cyanosis, or clubbing noted Skin: warm, dry Neuro: CN2-12 grossly intact. Strength 5/5 in upper and lower extremities. Reflexes symmetric and intact bilaterally.  Psych: Normal affect and thought content  Assessment/Plan:  Gout of right ankle No current symptoms. Check uric acid level.   Preventative Healthcare: Check lipid panel. Check HIV antibody.   Patient Counseling:  -Nutrition: Stressed importance of moderation in sodium/caffeine intake, saturated fat and cholesterol, caloric balance, sufficient intake of fresh fruits, vegetables, and fiber.  -Stressed the importance of regular  exercise.   -Substance Abuse: Discussed cessation/primary prevention of tobacco, alcohol, or other drug use; driving or other dangerous activities under the influence; availability of treatment for abuse.   -Injury prevention: Discussed safety belts, safety helmets, smoke detector, smoking near bedding or upholstery.   -Sexuality: Discussed sexually transmitted diseases, partner selection, use of condoms, avoidance of unintended pregnancy and contraceptive alternatives.   -Dental health: Discussed importance of regular tooth  brushing, flossing, and dental visits.  -Health maintenance and immunizations reviewed. Please refer to Health maintenance section.  Return to care in 1 year for next preventative visit.   Katina Degreealeb M. Jimmey RalphParker, MD 06/19/2017 9:49 AM

## 2017-06-19 NOTE — Patient Instructions (Signed)
Please try taking a fiber supplement.   Preventive Care 18-39 Years, Male Preventive care refers to lifestyle choices and visits with your health care provider that can promote health and wellness. What does preventive care include?  A yearly physical exam. This is also called an annual well check.  Dental exams once or twice a year.  Routine eye exams. Ask your health care provider how often you should have your eyes checked.  Personal lifestyle choices, including: ? Daily care of your teeth and gums. ? Regular physical activity. ? Eating a healthy diet. ? Avoiding tobacco and drug use. ? Limiting alcohol use. ? Practicing safe sex. What happens during an annual well check? The services and screenings done by your health care provider during your annual well check will depend on your age, overall health, lifestyle risk factors, and family history of disease. Counseling Your health care provider may ask you questions about your:  Alcohol use.  Tobacco use.  Drug use.  Emotional well-being.  Home and relationship well-being.  Sexual activity.  Eating habits.  Work and work Statistician.  Screening You may have the following tests or measurements:  Height, weight, and BMI.  Blood pressure.  Lipid and cholesterol levels. These may be checked every 5 years starting at age 13.  Diabetes screening. This is done by checking your blood sugar (glucose) after you have not eaten for a while (fasting).  Skin check.  Hepatitis C blood test.  Hepatitis B blood test.  Sexually transmitted disease (STD) testing.  Discuss your test results, treatment options, and if necessary, the need for more tests with your health care provider. Vaccines Your health care provider may recommend certain vaccines, such as:  Influenza vaccine. This is recommended every year.  Tetanus, diphtheria, and acellular pertussis (Tdap, Td) vaccine. You may need a Td booster every 10  years.  Varicella vaccine. You may need this if you have not been vaccinated.  HPV vaccine. If you are 48 or younger, you may need three doses over 6 months.  Measles, mumps, and rubella (MMR) vaccine. You may need at least one dose of MMR.You may also need a second dose.  Pneumococcal 13-valent conjugate (PCV13) vaccine. You may need this if you have certain conditions and have not been vaccinated.  Pneumococcal polysaccharide (PPSV23) vaccine. You may need one or two doses if you smoke cigarettes or if you have certain conditions.  Meningococcal vaccine. One dose is recommended if you are age 22-21 years and a first-year college student living in a residence hall, or if you have one of several medical conditions. You may also need additional booster doses.  Hepatitis A vaccine. You may need this if you have certain conditions or if you travel or work in places where you may be exposed to hepatitis A.  Hepatitis B vaccine. You may need this if you have certain conditions or if you travel or work in places where you may be exposed to hepatitis B.  Haemophilus influenzae type b (Hib) vaccine. You may need this if you have certain risk factors.  Talk to your health care provider about which screenings and vaccines you need and how often you need them. This information is not intended to replace advice given to you by your health care provider. Make sure you discuss any questions you have with your health care provider. Document Released: 05/24/2001 Document Revised: 12/16/2015 Document Reviewed: 01/27/2015 Elsevier Interactive Patient Education  [employer].

## 2017-06-19 NOTE — Procedures (Signed)
PROCEDURE: CUSTOM ORTHOTIC FABRICATION Patient's underlying musculoskeletal conditions are directly related to poor biomechanics and will benefit from a functional custom orthotic.  There are no significant foot deformities that complicate the use of a custom orthotic.  The patient was fitted for a standard, cushioned, semi-rigid orthotic. The orthotic was heated & placed on the orthotic stand. The patient was positioned in subtalar neutral position and 10 of ankle dorsiflexion and weight bearing stance on the heated orthotic blank. After completion of the molding a base was applied to the orthotic blank. The orthotic was ground to a stable position for weightbearing. The patient ambulated in these and reported they were comfortable without pressure spots.              BLANK:  Size 9 - Standard Cushioned                 BASE:  Blue EVA      POSTINGS:  n/a  

## 2017-06-19 NOTE — Assessment & Plan Note (Addendum)
No current symptoms. Check uric acid level.

## 2017-06-19 NOTE — Progress Notes (Signed)
Daniel Gilbert. [facility] [phone]  Daniel Gilbert - 32 y.o. male MRN [id number]  Date of birth: 03-05-1986  Visit Date: 06/19/2017  PCP: Ardith Dark, MD   Referred by: Ardith Dark, MD   Scribe for today's visit: Christoper Fabian, LAT, ATC     SUBJECTIVE:  Daniel Gilbert is here for Follow-up (R ankle pain) .   Info from OV on 05/19/17: Compared to the last office visit, his previously described symptoms are improving, he is concerned that he still has pain around the achilles when pointing his toes. He also have pain the grabbing his toes to stretch. He is able to run without trouble now.  Current symptoms are mild & are radiating to the achilles.  He had steroid injection 04/17/17. He was taking Colchicine for gout but that has been d/c. He was provided with Body Helix compression sleeve at last visit.    Compared to the last office visit on 05/19/17, his previously described R ankle painsymptoms are improving.  He states that he does still have pain in his R Achille's w/ active R ankle PF. Current symptoms are mild & are radiating to R Achille's w/ R ankle PF. He had been wearing his Body Helix ankle sleeve but is not wearing it anymore.   ROS Denies night time disturbances. Denies fevers, chills, or night sweats. Denies unexplained weight loss. Denies personal history of cancer. Denies changes in bowel or bladder habits. Denies recent unreported falls. Denies new or worsening dyspnea or wheezing. Denies headaches or dizziness.  Denies numbness, tingling or weakness  In the extremities.  Denies dizziness or presyncopal episodes Denies lower extremity edema    HISTORY & PERTINENT PRIOR DATA:  Prior History reviewed and updated per electronic medical record.  Significant history, findings, studies and interim changes include:  reports that  has never smoked. he has never used smokeless  tobacco. No results for input(s): HGBA1C, LABURIC, CREATINE in the last 8760 hours. No specialty comments available. No problems updated.  OBJECTIVE:  VS:  HT:    WT:   BMI:     BP:120/80  HR:60bpm  TEMP: ( )  RESP:97 %   PHYSICAL EXAM: Constitutional: WDWN, Non-toxic appearing. Psychiatric: Alert & appropriately interactive.  Not depressed or anxious appearing. Respiratory: No increased work of breathing.  Trachea Midline Eyes: Pupils are equal.  EOM intact without nystagmus.  No scleral icterus  NEUROVASCULAR exam: No clubbing or cyanosis appreciated No significant venous stasis changes Capillary Refill: normal, less than 2 seconds   Right foot and ankle otherwise well aligned.  He has improvement in ankle dorsiflexion and plantarflexion but still has some retrocalcaneal pain with terminal plantarflexion.  Improved metatarsal pain.   ASSESSMENT & PLAN:   1. Acute right ankle pain   2. Acute idiopathic gout of right ankle   3. Metatarsalgia of both feet   4. Gait disturbance     Orders & Meds: No orders of the defined types were placed in this encounter.  No orders of the defined types were placed in this encounter.    PLAN: Custom [employer] Orthotics fabricated today with good improvement in symptoms.  He does have fairly significant underlying changes of his feet and ankles contributing to underlying biomechanical changes.  He should continue with Alfredson exercises previously prescribed and we demonstrated today and will plan to follow-up with him as needed.   Follow-up: Return for as needed  for ongoing issues.   Pertinent documentation may be included in additional procedure notes, imaging studies, problem based documentation and patient instructions. Please see these sections of the encounter for additional information regarding this visit. CMA/ATC served as Neurosurgeonscribe during this visit. History, Physical, and Plan performed by medical provider. Documentation and orders  reviewed and attested to.      Daniel MewsMichael D Rigby, DO    [facility] Physician

## 2017-06-26 ENCOUNTER — Telehealth: Payer: Self-pay | Admitting: Infectious Disease

## 2017-06-26 NOTE — Telephone Encounter (Signed)
Patient has + first stage of HIV test (EIA vs p24) but discriminatory assay is negative.  Therefore this is either ACUTE HIV or a false positive EIA  He should have a qualitative RNA cooking but they can take as much as a week or longer to turn around  If there is sig concern he has acute HIV infection I would recommend bringing in for an HIV quantitative RNA (viral load) they are performed locally and come back within 2 days

## 2017-06-26 NOTE — Telephone Encounter (Signed)
Forwarding to PCP.

## 2017-06-27 NOTE — Telephone Encounter (Signed)
Low suspicion for acute HIV.  Antibody was checked as part of routine screening. We have contacted patient to come back for further testing - apparently the lab did not have enough sample to run the qualitative RNA.   Katina Degreealeb M. Jimmey RalphParker, MD 06/27/2017 12:41 PM

## 2017-06-27 NOTE — Telephone Encounter (Signed)
Thanks so much. I really wish we could just screen ppl with HIV quant RNA's but insurance probably wouldn't cover this unless one was saying one was testing for acute HIV. The only thing we miss with HIV RNA quant is HIV 2which is exceedingly rare. I would prefer a yes/no test that the HIV quant provides within 2 days rather than having to do all of this redrawing labs.

## 2017-06-28 ENCOUNTER — Other Ambulatory Visit (INDEPENDENT_AMBULATORY_CARE_PROVIDER_SITE_OTHER): Payer: 59

## 2017-06-28 ENCOUNTER — Other Ambulatory Visit: Payer: Self-pay

## 2017-06-28 ENCOUNTER — Telehealth: Payer: Self-pay | Admitting: Family Medicine

## 2017-06-28 DIAGNOSIS — Z21 Asymptomatic human immunodeficiency virus [HIV] infection status: Secondary | ICD-10-CM

## 2017-06-28 NOTE — Telephone Encounter (Signed)
Pt advised  Lab  Results    And  Plan of  Care   Scheduled  For  Lab   Draw   [facility]  Will make  Sure  Order  Is  Put in

## 2017-06-28 NOTE — Telephone Encounter (Signed)
Copied from CRM #[id number]. Topic: Quick Com930 [id number] - See Telephone Encounter >> Jun 28, 2017 12:20 PM Terisa Starraylor, Brittany L wrote: CRM for notification. See Telephone encounter for:   06/28/17.   Patient called for labs and NT was busy. Please call patient back [phone]

## 2017-06-28 NOTE — Telephone Encounter (Signed)
Attempted to contact pt regarding lab results; left message for pt to call back on [phone].

## 2017-06-29 LAB — HIV-1/2 AB - DIFFERENTIATION
HIV 2 AB: NEGATIVE
HIV-1 antibody: NEGATIVE

## 2017-06-29 LAB — HIV ANTIBODY (ROUTINE TESTING W REFLEX): HIV 1&2 Ab, 4th Generation: REACTIVE — AB

## 2017-06-29 LAB — HIV-1 RNA, QUALITATIVE, TMA: HIV-1 RNA, Qualitative, TMA: NOT DETECTED

## 2017-07-03 LAB — HIV-1 RNA, QUALITATIVE, TMA: HIV-1 RNA, Qualitative, TMA: NOT DETECTED

## 2017-07-04 ENCOUNTER — Telehealth: Payer: Self-pay | Admitting: Family Medicine

## 2017-07-04 NOTE — Progress Notes (Signed)
Dr Lavone NeriParker's interpretation of your lab work:  Your HIV test is NEGATIVE. Your initial test was a false positive, but both of your confirmatory tests have been negative. You do not have HIV.    If you have any additional questions, please give us a call or send us a message through [email or web address].  Take care, Dr Jimmey RalphParker

## 2017-07-04 NOTE — Telephone Encounter (Signed)
Pt called returning for  HIV  Confirmation   Results  Encounter  Not in  Pec  Results  Que   CRM  Viewed  Giving permission to give  Results    Pt  Notified  Of  Neg  Results  In both  Confirmation    Results

## 2017-08-29 ENCOUNTER — Ambulatory Visit: Payer: 59 | Admitting: Family Medicine

## 2017-08-29 ENCOUNTER — Encounter: Payer: Self-pay | Admitting: Family Medicine

## 2017-08-29 VITALS — BP 102/78 | HR 71 | Temp 98.8°F | Resp 16 | Ht 71.0 in | Wt 187.0 lb

## 2017-08-29 DIAGNOSIS — H61892 Other specified disorders of left external ear: Secondary | ICD-10-CM | POA: Diagnosis not present

## 2017-08-29 DIAGNOSIS — M5442 Lumbago with sciatica, left side: Secondary | ICD-10-CM

## 2017-08-29 MED ORDER — CYCLOBENZAPRINE HCL 10 MG PO TABS: 10.0000 mg | ORAL_TABLET | Freq: Three times a day (TID) | ORAL | 0 refills | Status: DC | PRN

## 2017-08-29 MED ORDER — DICLOFENAC SODIUM 75 MG PO TBEC
75.0000 mg | DELAYED_RELEASE_TABLET | Freq: Two times a day (BID) | ORAL | 0 refills | Status: DC
Start: 2017-08-29 — End: 2017-12-04

## 2017-08-29 MED ORDER — METHYLPREDNISOLONE ACETATE 80 MG/ML IJ SUSP
80.0000 mg | Freq: Once | INTRAMUSCULAR | Status: AC
  Administered 2017-08-29: 80 mg via INTRAMUSCULAR

## 2017-08-29 MED ORDER — ACETIC ACID 2 % OT SOLN: 4.0000 [drp] | Freq: Four times a day (QID) | OTIC | 0 refills | Status: DC

## 2017-08-29 NOTE — Progress Notes (Signed)
    Subjective:  Daniel Gilbert is a 32 y.o. male who presents today for same-day appointment with a chief complaint of back pain.   HPI:  Back Pain, acute problem Started 2 weeks ago. Worse over the last few days after putting together gym set.  Pain mostly located in left lower back. Occasionally radiates into left leg. Went to chiropractor which has helped some. No weakness or numbness. No reported bowel or bladder incontinence. Worse with movement. Worse at night. No other obvious alleviating or aggravating factors.   Left ear irritation, acute issue Started within the last few days. Went swimming and is not sure if he was able to remove all of the water. No reported drainage to the area.   ROS: Per HPI  PMH: He reports that he has never smoked. He has never used smokeless tobacco. He reports that he drinks alcohol. He reports that he does not use drugs.  Objective:  Physical Exam: BP 102/78   Pulse 71   Temp 98.8 F (37.1 C)   Resp 16   Ht  (1.803 m)   Wt 187 lb (84.8 kg)   SpO2 99%   BMI 26.08 kg/m   Gen: NAD, resting comfortably HEENT: Right EAC normal. Left EAC erythematous without exudate or drainage CV: RRR with no murmurs appreciated Pulm: NWOB, CTAB with no crackles, wheezes, or rhonchi MSK:  -Back: No deformities. TTP in left lower lumbar paraspinal muscles. -LE: No deformities. Nontender to palpation. Patellar reflexes 2+ and symmetric bilaterally. SLR negative bilaterally.  Skin: Warm, dry  Assessment/Plan:  Low back pain No red flag signs or symptoms. Likely musculoskeletal. Treat with flexeril and diclofenac. Discussed home exercise program focusing on strengthening core musculature. Will give  depomedrol today as well. Return precautions reviewed. Follow up as needed.  Left EAC irritation No signs of severe otitis externa, but will treat with acetic drops to cover this and possible fungal etiologies. Return precautions reviewed. Follow up  as needed.   Katina Degree. Jimmey Ralph, MD 08/29/2017 11:10 AM

## 2017-08-29 NOTE — Patient Instructions (Signed)
It was nice to see you today!  I think you are having a muscle spasm.  Please take the medications and work on the exercises.   We will give you an injection of an anti-inflammatory steroid today.  Please use the ear drops if your ear irritation does not continue to improve.   Take care, Dr Jimmey Ralph

## 2017-08-31 DIAGNOSIS — M545 Low back pain: Secondary | ICD-10-CM | POA: Diagnosis not present

## 2017-08-31 DIAGNOSIS — M5136 Other intervertebral disc degeneration, lumbar region: Secondary | ICD-10-CM | POA: Diagnosis not present

## 2017-09-18 DIAGNOSIS — M545 Low back pain: Secondary | ICD-10-CM | POA: Diagnosis not present

## 2017-09-18 DIAGNOSIS — M6281 Muscle weakness (generalized): Secondary | ICD-10-CM | POA: Diagnosis not present

## 2017-09-21 DIAGNOSIS — M6281 Muscle weakness (generalized): Secondary | ICD-10-CM | POA: Diagnosis not present

## 2017-09-21 DIAGNOSIS — M545 Low back pain: Secondary | ICD-10-CM | POA: Diagnosis not present

## 2017-09-22 DIAGNOSIS — M6281 Muscle weakness (generalized): Secondary | ICD-10-CM | POA: Diagnosis not present

## 2017-09-22 DIAGNOSIS — M545 Low back pain: Secondary | ICD-10-CM | POA: Diagnosis not present

## 2017-09-25 DIAGNOSIS — M6281 Muscle weakness (generalized): Secondary | ICD-10-CM | POA: Diagnosis not present

## 2017-09-25 DIAGNOSIS — M545 Low back pain: Secondary | ICD-10-CM | POA: Diagnosis not present

## 2017-10-06 DIAGNOSIS — M6281 Muscle weakness (generalized): Secondary | ICD-10-CM | POA: Diagnosis not present

## 2017-10-06 DIAGNOSIS — M545 Low back pain: Secondary | ICD-10-CM | POA: Diagnosis not present

## 2017-10-11 DIAGNOSIS — M6281 Muscle weakness (generalized): Secondary | ICD-10-CM | POA: Diagnosis not present

## 2017-10-11 DIAGNOSIS — M545 Low back pain: Secondary | ICD-10-CM | POA: Diagnosis not present

## 2017-10-16 DIAGNOSIS — M545 Low back pain: Secondary | ICD-10-CM | POA: Diagnosis not present

## 2017-10-16 DIAGNOSIS — M6281 Muscle weakness (generalized): Secondary | ICD-10-CM | POA: Diagnosis not present

## 2017-10-20 DIAGNOSIS — M6281 Muscle weakness (generalized): Secondary | ICD-10-CM | POA: Diagnosis not present

## 2017-10-20 DIAGNOSIS — M545 Low back pain: Secondary | ICD-10-CM | POA: Diagnosis not present

## 2017-10-20 DIAGNOSIS — M25552 Pain in left hip: Secondary | ICD-10-CM | POA: Diagnosis not present

## 2017-10-25 DIAGNOSIS — M545 Low back pain: Secondary | ICD-10-CM | POA: Diagnosis not present

## 2017-10-25 DIAGNOSIS — M25552 Pain in left hip: Secondary | ICD-10-CM | POA: Diagnosis not present

## 2017-10-25 DIAGNOSIS — M6281 Muscle weakness (generalized): Secondary | ICD-10-CM | POA: Diagnosis not present

## 2017-11-06 DIAGNOSIS — M25552 Pain in left hip: Secondary | ICD-10-CM | POA: Diagnosis not present

## 2017-11-06 DIAGNOSIS — M6281 Muscle weakness (generalized): Secondary | ICD-10-CM | POA: Diagnosis not present

## 2017-11-06 DIAGNOSIS — M545 Low back pain: Secondary | ICD-10-CM | POA: Diagnosis not present

## 2017-11-17 DIAGNOSIS — M5416 Radiculopathy, lumbar region: Secondary | ICD-10-CM | POA: Diagnosis not present

## 2017-11-17 DIAGNOSIS — M5136 Other intervertebral disc degeneration, lumbar region: Secondary | ICD-10-CM | POA: Diagnosis not present

## 2017-11-24 DIAGNOSIS — M5416 Radiculopathy, lumbar region: Secondary | ICD-10-CM | POA: Diagnosis not present

## 2017-11-25 DIAGNOSIS — M5416 Radiculopathy, lumbar region: Secondary | ICD-10-CM | POA: Diagnosis not present

## 2017-12-04 ENCOUNTER — Encounter: Payer: Self-pay | Admitting: Family Medicine

## 2017-12-04 ENCOUNTER — Ambulatory Visit: Payer: 59 | Admitting: Family Medicine

## 2017-12-04 DIAGNOSIS — M545 Low back pain: Secondary | ICD-10-CM | POA: Diagnosis not present

## 2017-12-04 DIAGNOSIS — G8929 Other chronic pain: Secondary | ICD-10-CM | POA: Diagnosis not present

## 2017-12-04 NOTE — Patient Instructions (Signed)
It was very nice to see you today!  You are clear to have surgery. We will fax the letter back to your surgeon.  You are up to date on your whooping cough vaccine.   Please come in late September or early October for your flu vaccine.  Come back next year for your annual physical, or sooner as needed.  Take care, Dr Jimmey RalphParker

## 2017-12-04 NOTE — Progress Notes (Signed)
   Subjective:  Daniel KayRobert Charles Rembold is a 32 y.o. male who presents today with a chief complaint of back pain.   HPI:  Back Pain, chronic problem, worsening Patient with several month history of lower back pain.  He was initially evaluated in this office 3 months ago.  We started him on Flexeril diclofenac with modest improvement.  He has tried several other treatments since then including physical therapy, chiropractor, and cortisone shots with modest relief.  He additionally started having some radiation down into his left leg.  He has been evaluated by orthopedics who recommended L4-L5 discectomy.  He presents today for surgical clearance.  He will be undergoing his surgery on 12/15/2017.  No reported chest pain or shortness of breath.  Normal exercise tolerance.  ROS: Per HPI  PMH: He reports that he has never smoked. He has never used smokeless tobacco. He reports that he drinks alcohol. He reports that he does not use drugs.  Objective:  Physical Exam: BP 126/82 (BP Location: Left Arm, Patient Position: Sitting, Cuff Size: Normal)   Pulse 69   Temp 98.4 F (36.9 C) (Oral)   Ht 5\' 11"  (1.803 m)   Wt 188 lb 12.8 oz (85.6 kg)   SpO2 99%   BMI 26.33 kg/m   Gen: NAD, resting comfortably CV: RRR with no murmurs appreciated Pulm: NWOB, CTAB with no crackles, wheezes, or rhonchi GI: Normal bowel sounds present. Soft, Nontender, Nondistended. MSK: Back without deformities.  Mildly tender to palpation along lumbar spinal area.  Lower extremities without deformities.  Neurovascular intact distally.  Assessment/Plan:  Chronic low back pain Patient with no risk factors and undergoing surgery with low cardiovascular risk.  We will clear patient for surgery.  Form was completed and faxed to his orthopedics office. He will continue using ibuprofen as needed in the interim for his back pain.   Katina Degreealeb M. Jimmey RalphParker, MD 12/04/2017 8:44 AM

## 2017-12-04 NOTE — Assessment & Plan Note (Signed)
Patient with no risk factors and undergoing surgery with low cardiovascular risk.  We will clear patient for surgery.  Form was completed and faxed to his orthopedics office. He will continue using ibuprofen as needed in the interim for his back pain.

## 2017-12-08 DIAGNOSIS — M5416 Radiculopathy, lumbar region: Secondary | ICD-10-CM | POA: Diagnosis not present

## 2017-12-15 DIAGNOSIS — M5127 Other intervertebral disc displacement, lumbosacral region: Secondary | ICD-10-CM | POA: Diagnosis not present

## 2018-02-09 DIAGNOSIS — M5416 Radiculopathy, lumbar region: Secondary | ICD-10-CM | POA: Diagnosis not present

## 2018-02-12 DIAGNOSIS — M5416 Radiculopathy, lumbar region: Secondary | ICD-10-CM | POA: Diagnosis not present

## 2018-02-16 DIAGNOSIS — M5416 Radiculopathy, lumbar region: Secondary | ICD-10-CM | POA: Diagnosis not present

## 2018-02-19 DIAGNOSIS — M5416 Radiculopathy, lumbar region: Secondary | ICD-10-CM | POA: Diagnosis not present

## 2018-02-23 DIAGNOSIS — M5416 Radiculopathy, lumbar region: Secondary | ICD-10-CM | POA: Diagnosis not present

## 2018-02-26 DIAGNOSIS — M5416 Radiculopathy, lumbar region: Secondary | ICD-10-CM | POA: Diagnosis not present

## 2018-03-13 DIAGNOSIS — M6281 Muscle weakness (generalized): Secondary | ICD-10-CM | POA: Diagnosis not present

## 2018-03-13 DIAGNOSIS — M62830 Muscle spasm of back: Secondary | ICD-10-CM | POA: Diagnosis not present

## 2018-03-13 DIAGNOSIS — M545 Low back pain: Secondary | ICD-10-CM | POA: Diagnosis not present

## 2018-03-16 DIAGNOSIS — M545 Low back pain: Secondary | ICD-10-CM | POA: Diagnosis not present

## 2018-03-16 DIAGNOSIS — M6281 Muscle weakness (generalized): Secondary | ICD-10-CM | POA: Diagnosis not present

## 2018-03-19 DIAGNOSIS — M545 Low back pain: Secondary | ICD-10-CM | POA: Diagnosis not present

## 2018-03-19 DIAGNOSIS — M6281 Muscle weakness (generalized): Secondary | ICD-10-CM | POA: Diagnosis not present

## 2018-03-23 ENCOUNTER — Other Ambulatory Visit: Payer: Self-pay | Admitting: Family Medicine

## 2018-03-26 DIAGNOSIS — M25552 Pain in left hip: Secondary | ICD-10-CM | POA: Diagnosis not present

## 2018-03-26 DIAGNOSIS — M6281 Muscle weakness (generalized): Secondary | ICD-10-CM | POA: Diagnosis not present

## 2018-03-26 DIAGNOSIS — M545 Low back pain: Secondary | ICD-10-CM | POA: Diagnosis not present

## 2018-04-09 DIAGNOSIS — M25552 Pain in left hip: Secondary | ICD-10-CM | POA: Diagnosis not present

## 2018-04-09 DIAGNOSIS — M62830 Muscle spasm of back: Secondary | ICD-10-CM | POA: Diagnosis not present

## 2018-04-09 DIAGNOSIS — M545 Low back pain: Secondary | ICD-10-CM | POA: Diagnosis not present

## 2018-04-20 DIAGNOSIS — M25552 Pain in left hip: Secondary | ICD-10-CM | POA: Diagnosis not present

## 2018-04-20 DIAGNOSIS — M545 Low back pain: Secondary | ICD-10-CM | POA: Diagnosis not present

## 2018-04-20 DIAGNOSIS — M6281 Muscle weakness (generalized): Secondary | ICD-10-CM | POA: Diagnosis not present

## 2018-04-25 DIAGNOSIS — M6281 Muscle weakness (generalized): Secondary | ICD-10-CM | POA: Diagnosis not present

## 2018-04-25 DIAGNOSIS — M545 Low back pain: Secondary | ICD-10-CM | POA: Diagnosis not present

## 2018-04-25 DIAGNOSIS — M62830 Muscle spasm of back: Secondary | ICD-10-CM | POA: Diagnosis not present

## 2018-05-02 DIAGNOSIS — M6281 Muscle weakness (generalized): Secondary | ICD-10-CM | POA: Diagnosis not present

## 2018-05-02 DIAGNOSIS — M25552 Pain in left hip: Secondary | ICD-10-CM | POA: Diagnosis not present

## 2018-05-02 DIAGNOSIS — M545 Low back pain: Secondary | ICD-10-CM | POA: Diagnosis not present

## 2018-05-14 DIAGNOSIS — M6281 Muscle weakness (generalized): Secondary | ICD-10-CM | POA: Diagnosis not present

## 2018-05-14 DIAGNOSIS — M62830 Muscle spasm of back: Secondary | ICD-10-CM | POA: Diagnosis not present

## 2018-05-14 DIAGNOSIS — M545 Low back pain: Secondary | ICD-10-CM | POA: Diagnosis not present

## 2018-06-04 DIAGNOSIS — M25552 Pain in left hip: Secondary | ICD-10-CM | POA: Diagnosis not present

## 2018-06-04 DIAGNOSIS — M545 Low back pain: Secondary | ICD-10-CM | POA: Diagnosis not present

## 2018-06-29 DIAGNOSIS — M62830 Muscle spasm of back: Secondary | ICD-10-CM | POA: Diagnosis not present

## 2018-06-29 DIAGNOSIS — M545 Low back pain: Secondary | ICD-10-CM | POA: Diagnosis not present

## 2018-06-29 DIAGNOSIS — M6281 Muscle weakness (generalized): Secondary | ICD-10-CM | POA: Diagnosis not present

## 2018-07-20 DIAGNOSIS — M545 Low back pain: Secondary | ICD-10-CM | POA: Diagnosis not present

## 2018-07-20 DIAGNOSIS — M25552 Pain in left hip: Secondary | ICD-10-CM | POA: Diagnosis not present

## 2018-07-20 DIAGNOSIS — M62830 Muscle spasm of back: Secondary | ICD-10-CM | POA: Diagnosis not present

## 2018-08-31 DIAGNOSIS — M545 Low back pain: Secondary | ICD-10-CM | POA: Diagnosis not present

## 2018-10-09 ENCOUNTER — Ambulatory Visit (INDEPENDENT_AMBULATORY_CARE_PROVIDER_SITE_OTHER): Payer: 59 | Admitting: Family Medicine

## 2018-10-09 ENCOUNTER — Other Ambulatory Visit: Payer: Self-pay

## 2018-10-09 ENCOUNTER — Encounter: Payer: Self-pay | Admitting: Family Medicine

## 2018-10-09 VITALS — BP 128/74 | HR 67 | Temp 98.5°F | Ht 71.0 in | Wt 182.2 lb

## 2018-10-09 DIAGNOSIS — M109 Gout, unspecified: Secondary | ICD-10-CM | POA: Diagnosis not present

## 2018-10-09 DIAGNOSIS — Z0001 Encounter for general adult medical examination with abnormal findings: Secondary | ICD-10-CM | POA: Diagnosis not present

## 2018-10-09 DIAGNOSIS — E782 Mixed hyperlipidemia: Secondary | ICD-10-CM

## 2018-10-09 DIAGNOSIS — Z6825 Body mass index (BMI) 25.0-25.9, adult: Secondary | ICD-10-CM | POA: Diagnosis not present

## 2018-10-09 DIAGNOSIS — R195 Other fecal abnormalities: Secondary | ICD-10-CM | POA: Insufficient documentation

## 2018-10-09 LAB — URIC ACID: Uric Acid, Serum: 8.8 mg/dL — ABNORMAL HIGH (ref 4.0–7.8)

## 2018-10-09 LAB — LIPID PANEL
Cholesterol: 263 mg/dL — ABNORMAL HIGH (ref 0–200)
HDL: 56.7 mg/dL (ref >39.00)
LDL Cholesterol: 168 mg/dL — ABNORMAL HIGH (ref 0–99)
NonHDL: 206
Total CHOL/HDL Ratio: 5
Triglycerides: 190 mg/dL — ABNORMAL HIGH (ref 0.0–149.0)
VLDL: 38 mg/dL (ref 0.0–40.0)

## 2018-10-09 MED ORDER — MELOXICAM 15 MG PO TABS: 15.0000 mg | ORAL_TABLET | Freq: Every day | ORAL | 0 refills | Status: DC | PRN

## 2018-10-09 NOTE — Progress Notes (Signed)
Chief Complaint:  Daniel Gilbert is a 33 y.o. male who presents today for his annual comprehensive physical exam.    Assessment/Plan:  Gout No major flares.  Discussed treatment options.  Will start Mobic as needed for flares.  Does not want to use colchicine due to GI side effects.  Does not take daily medication at this point.  Will check uric acid level.  Combined hyperlipidemia Continue lifestyle modifications.  Check lipid panel.  Loose stools No red flags.  Benign exam.  Recommended least 35 g of fiber per day and good oral hydration.  If no improvement, will need referral to GI.  BMI 25 Discussed lifestyle modifications  Preventative Healthcare: UTD on vaccines and screenings.   Patient Counseling(The following topics were reviewed and/or handout was given):  -Nutrition: Stressed importance of moderation in sodium/caffeine intake, saturated fat and cholesterol, caloric balance, sufficient intake of fresh fruits, vegetables, and fiber.  -Stressed the importance of regular exercise.   -Substance Abuse: Discussed cessation/primary prevention of tobacco, alcohol, or other drug use; driving or other dangerous activities under the influence; availability of treatment for abuse.   -Injury prevention: Discussed safety belts, safety helmets, smoke detector, smoking near bedding or upholstery.   -Sexuality: Discussed sexually transmitted diseases, partner selection, use of condoms, avoidance of unintended pregnancy and contraceptive alternatives.   -Dental health: Discussed importance of regular tooth brushing, flossing, and dental visits.  -Health maintenance and immunizations reviewed. Please refer to Health maintenance section.  Return to care in 1 year for next preventative visit.     Subjective:  HPI:  He has no acute complaints today.   He has had some issues with intermittent constipation and diarrhea. Seems to be improving with fiber. No nausea or vomiting.   #  Gout - Has had a few recent mild flares usually managed with OTC antiinflammatories  Lifestyle Diet: Tries to eat a healthy and balanced diet.  Exercise: Likes to go running and work out most days of the week.   Depression screen [facility] 2/9 10/09/2018  Decreased Interest 0  Down, Depressed, Hopeless 0  PHQ - 2 Score 0   ROS: Per HPI, otherwise a complete review of systems was negative.   PMH:  The following were reviewed and entered/updated in epic: Past Medical History:  Diagnosis Date  . Hyperlipidemia    Patient Active Problem List   Diagnosis Date Noted  . Loose stools 10/09/2018  . Chronic low back pain 12/04/2017  . Gout 05/29/2017  . 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 CLAVICLE SURGERY Right   . ROTATOR CUFF REPAIR Right     Family History  Problem Relation Age of Onset  . Depression Mother   . Heart attack Maternal Grandfather   . Hyperlipidemia Maternal Grandfather   . Stroke Maternal Grandfather   . Alcohol abuse Paternal Grandfather   . Cancer Paternal Grandfather     Medications- reviewed and updated Current Outpatient Medications  Medication Sig Dispense Refill  . ibuprofen (ADVIL,MOTRIN) 200 MG tablet Take 200 mg by mouth every 8 (eight) hours as needed.    . pantoprazole (PROTONIX) 40 MG tablet TAKE 1 TABLET BY MOUTH EVERY DAY 30 tablet 3  . meloxicam (MOBIC) 15 MG tablet Take 1 tablet (15 mg total) by mouth daily as needed for pain (gout flares). 30 tablet 0   No current facility-administered medications for this visit.     Allergies-reviewed and updated No Known Allergies  Social History   Socioeconomic History  . Marital status: Married    Spouse name: Not on file  . Number of children: 1  . Years of education: Not on file  . Highest education level: Not on file  Occupational History  . Occupation: Production designer, theatre/television/filmManager  Social Needs  . Financial resource strain: Not on file  . Food insecurity     Worry: Not on file    Inability: Not on file  . Transportation needs    Medical: Not on file    Non-medical: Not on file  Tobacco Use  . Smoking status: Never Smoker  . Smokeless tobacco: Never Used  Substance and Sexual Activity  . Alcohol use: Yes  . Drug use: No  . Sexual activity: Yes  Lifestyle  . Physical activity    Days per week: Not on file    Minutes per session: Not on file  . Stress: Not on file  Relationships  . Social Musicianconnections    Talks on phone: Not on file    Gets together: Not on file    Attends religious service: Not on file    Active member of club or organization: Not on file    Attends meetings of clubs or organizations: Not on file    Relationship status: Not on file  Other Topics Concern  . Not on file  Social History Narrative  . Not on file        Objective:  Physical Exam: BP 128/74 (BP Location: Left Arm, Patient Position: Sitting, Cuff Size: Normal)   Pulse 67   Temp 98.5 F (36.9 C) (Oral)   Ht 5\' 11"  (1.803 m)   Wt 182 lb 3.2 oz (82.6 kg)   SpO2 98%   BMI 25.41 kg/m   Body mass index is 25.41 kg/m. Wt Readings from Last 3 Encounters:  10/09/18 182 lb 3.2 oz (82.6 kg)  12/04/17 188 lb 12.8 oz (85.6 kg)  08/29/17 187 lb (84.8 kg)   Gen: NAD, resting comfortably HEENT: TMs normal bilaterally. OP clear. No thyromegaly noted.  CV: RRR with no murmurs appreciated Pulm: NWOB, CTAB with no crackles, wheezes, or rhonchi GI: Normal bowel sounds present. Soft, Nontender, Nondistended. MSK: no edema, cyanosis, or clubbing noted Skin: warm, dry Neuro: CN2-12 grossly intact. Strength 5/5 in upper and lower extremities. Reflexes symmetric and intact bilaterally.  Psych: Normal affect and thought content      M. Jimmey RalphParker, MD 10/09/2018 8:36 AM

## 2018-10-09 NOTE — Assessment & Plan Note (Signed)
Continue lifestyle modifications.  Check lipid panel. 

## 2018-10-09 NOTE — Assessment & Plan Note (Signed)
No major flares.  Discussed treatment options.  Will start Mobic as needed for flares.  Does not want to use colchicine due to GI side effects.  Does not take daily medication at this point.  Will check uric acid level.

## 2018-10-09 NOTE — Assessment & Plan Note (Signed)
No red flags.  Benign exam.  Recommended least 35 g of fiber per day and good oral hydration.  If no improvement, will need referral to GI.

## 2018-10-09 NOTE — Patient Instructions (Signed)
It was very nice to see you today!  Please take the Mobic as needed for flares.  Please make sure that you get 35 to 40 g of fiber in your diet.  Please make sure you are getting plenty of fluids.  We will check blood work today.   Eat at least 3 REAL meals and 1-2 snacks per day.  Aim for no more than 5 hours between eating.  Eat breakfast within one hour of getting up.    Obtain twice as many fruits/vegetables as protein or carbohydrate foods for both lunch and dinner.   Cut down on sweet beverages. This includes juice, soda, and sweet tea.    Exercise at least 150 minutes every week.   Come back to see me in 1 year for your next physical, or sooner as needed.  Take care, Dr [name]   Preventive Care 105-34 Years Old, Male Preventive care refers to lifestyle choices and visits with your health care provider that can promote health and wellness. This includes:  A yearly physical exam. This is also called an annual well check.  Regular dental and eye exams.  Immunizations.  Screening for certain conditions.  Healthy lifestyle choices, such as eating a healthy diet, getting regular exercise, not using drugs or products that contain nicotine and tobacco, and limiting alcohol use. What can I expect for my preventive care visit? Physical exam Your health care provider will check:  Height and weight. These may be used to calculate body mass index (BMI), which is a measurement that tells if you are at a healthy weight.  Heart rate and blood pressure.  Your skin for abnormal spots. Counseling Your health care provider may ask you questions about:  Alcohol, tobacco, and drug use.  Emotional well-being.  Home and relationship well-being.  Sexual activity.  Eating habits.  Work and work Statistician. What immunizations do I need?  Influenza (flu) vaccine  This is recommended every year. Tetanus, diphtheria, and pertussis (Tdap) vaccine  You may need a Td booster  every 10 years. Varicella (chickenpox) vaccine  You may need this vaccine if you have not already been vaccinated. Human papillomavirus (HPV) vaccine  If recommended by your health care provider, you may need three doses over 6 months. Measles, mumps, and rubella (MMR) vaccine  You may need at least one dose of MMR. You may also need a second dose. Meningococcal conjugate (MenACWY) vaccine  One dose is recommended if you are 34-46 years old and a Market researcher living in a residence hall, or if you have one of several medical conditions. You may also need additional booster doses. Pneumococcal conjugate (PCV13) vaccine  You may need this if you have certain conditions and were not previously vaccinated. Pneumococcal polysaccharide (PPSV23) vaccine  You may need one or two doses if you smoke cigarettes or if you have certain conditions. Hepatitis A vaccine  You may need this if you have certain conditions or if you travel or work in places where you may be exposed to hepatitis A. Hepatitis B vaccine  You may need this if you have certain conditions or if you travel or work in places where you may be exposed to hepatitis B. Haemophilus influenzae type b (Hib) vaccine  You may need this if you have certain risk factors. You may receive vaccines as individual doses or as more than one vaccine together in one shot (combination vaccines). Talk with your health care provider about the risks and benefits of combination  vaccines. What tests do I need? Blood tests  Lipid and cholesterol levels. These may be checked every 5 years starting at age 66.  Hepatitis C test.  Hepatitis B test. Screening   Diabetes screening. This is done by checking your blood sugar (glucose) after you have not eaten for a while (fasting).  Sexually transmitted disease (STD) testing. Talk with your health care provider about your test results, treatment options, and if necessary, the need for  more tests. Follow these instructions at home: Eating and drinking   Eat a diet that includes fresh fruits and vegetables, whole grains, lean protein, and low-fat dairy products.  Take vitamin and mineral supplements as recommended by your health care provider.  Do not drink alcohol if your health care provider tells you not to drink.  If you drink alcohol: ? Limit how much you have to 0-2 drinks a day. ? Be aware of how much alcohol is in your drink. In the [place] one drink equals one 12 oz bottle of beer (355 mL), one 5 oz glass of wine (148 mL), or one 1 oz glass of hard liquor (44 mL). Lifestyle  Take daily care of your teeth and gums.  Stay active. Exercise for at least 30 minutes on 5 or more days each week.  Do not use any products that contain nicotine or tobacco, such as cigarettes, e-cigarettes, and chewing tobacco. If you need help quitting, ask your health care provider.  If you are sexually active, practice safe sex. Use a condom or other form of protection to prevent STIs (sexually transmitted infections). What's next?  Go to your health care provider once a year for a well check visit.  Ask your health care provider how often you should have your eyes and teeth checked.  Stay up to date on all vaccines. This information is not intended to replace advice given to you by your health care provider. Make sure you discuss any questions you have with your health care provider. Document Released: 05/24/2001 Document Revised: 03/22/2018 Document Reviewed: 03/22/2018 Elsevier Patient Education  2020 [employer].

## 2018-10-10 ENCOUNTER — Other Ambulatory Visit: Payer: Self-pay

## 2018-10-10 NOTE — Progress Notes (Signed)
Notified patient of lab results.Patient voices understanding. What dosage for the allopurinol. Please advise

## 2018-10-10 NOTE — Progress Notes (Signed)
Please inform patient of the following:  Cholesterol elevated.  Not at the point needed to start medications.  Would like for him to continue work on diet and exercise and we can recheck in year.  Uric acid level also elevated 8.8.  For gout should ideally be closer to 6.  Consider low-dose allopurinol if he wishes - we discussed at his office visit.  Please send if the patient requests.

## 2018-10-11 ENCOUNTER — Telehealth: Payer: Self-pay | Admitting: Family Medicine

## 2018-10-11 ENCOUNTER — Other Ambulatory Visit: Payer: Self-pay

## 2018-10-11 MED ORDER — ALLOPURINOL 100 MG PO TABS: 100.0000 mg | ORAL_TABLET | Freq: Every day | ORAL | 0 refills | Status: DC

## 2018-10-11 NOTE — Telephone Encounter (Signed)
Rx sent patient notified 

## 2018-10-11 NOTE — Telephone Encounter (Signed)
Patient stated he received a call from the nurse saying that a prescription for Allopurinol was going to be sent in to his preferred pharmacy [facility] on [address] and [address]  Please advise.

## 2018-11-01 ENCOUNTER — Other Ambulatory Visit: Payer: Self-pay | Admitting: Family Medicine

## 2018-11-03 ENCOUNTER — Other Ambulatory Visit: Payer: Self-pay | Admitting: Family Medicine

## 2018-12-17 ENCOUNTER — Other Ambulatory Visit: Payer: Self-pay | Admitting: Family Medicine

## 2018-12-19 MED ORDER — ALLOPURINOL 100 MG PO TABS: 100.0000 mg | ORAL_TABLET | Freq: Every day | ORAL | 0 refills | Status: DC

## 2018-12-19 MED ORDER — MELOXICAM 15 MG PO TABS: 15.0000 mg | ORAL_TABLET | Freq: Every day | ORAL | 0 refills | Status: DC

## 2019-02-12 ENCOUNTER — Other Ambulatory Visit: Payer: Self-pay | Admitting: Family Medicine

## 2019-02-13 ENCOUNTER — Other Ambulatory Visit: Payer: Self-pay | Admitting: Family Medicine

## 2019-04-08 ENCOUNTER — Ambulatory Visit (INDEPENDENT_AMBULATORY_CARE_PROVIDER_SITE_OTHER): Payer: 59 | Admitting: Family Medicine

## 2019-04-08 ENCOUNTER — Other Ambulatory Visit: Payer: Self-pay

## 2019-04-08 ENCOUNTER — Encounter: Payer: Self-pay | Admitting: Family Medicine

## 2019-04-08 ENCOUNTER — Other Ambulatory Visit: Payer: Self-pay | Admitting: Family Medicine

## 2019-04-08 VITALS — BP 122/76 | HR 59 | Temp 98.0°F | Ht 71.0 in | Wt 182.2 lb

## 2019-04-08 DIAGNOSIS — Z23 Encounter for immunization: Secondary | ICD-10-CM | POA: Diagnosis not present

## 2019-04-08 DIAGNOSIS — R221 Localized swelling, mass and lump, neck: Secondary | ICD-10-CM | POA: Diagnosis not present

## 2019-04-08 LAB — COMPREHENSIVE METABOLIC PANEL
ALT: 25 U/L (ref 0–53)
AST: 26 U/L (ref 0–37)
Albumin: 4.7 g/dL (ref 3.5–5.2)
Alkaline Phosphatase: 35 U/L — ABNORMAL LOW (ref 39–117)
BUN: 19 mg/dL (ref 6–23)
CO2: 30 mEq/L (ref 19–32)
Calcium: 9.6 mg/dL (ref 8.4–10.5)
Chloride: 102 mEq/L (ref 96–112)
Creatinine, Ser: 1.09 mg/dL (ref 0.40–1.50)
GFR: 77.28 mL/min (ref >60.00)
Glucose, Bld: 109 mg/dL — ABNORMAL HIGH (ref 70–99)
Potassium: 4.1 mEq/L (ref 3.5–5.1)
Sodium: 138 mEq/L (ref 135–145)
Total Bilirubin: 0.7 mg/dL (ref 0.2–1.2)
Total Protein: 7.1 g/dL (ref 6.0–8.3)

## 2019-04-08 LAB — CBC WITH DIFFERENTIAL/PLATELET
Basophils Absolute: 0 10*3/uL (ref 0.0–0.1)
Basophils Relative: 0.6 % (ref 0.0–3.0)
Eosinophils Absolute: 0.1 10*3/uL (ref 0.0–0.7)
Eosinophils Relative: 2.3 % (ref 0.0–5.0)
HCT: 46.2 % (ref 39.0–52.0)
Hemoglobin: 15.5 g/dL (ref 13.0–17.0)
Lymphocytes Relative: 40.4 % (ref 12.0–46.0)
Lymphs Abs: 2.2 10*3/uL (ref 0.7–4.0)
MCHC: 33.6 g/dL (ref 30.0–36.0)
MCV: 87.9 fl (ref 78.0–100.0)
Monocytes Absolute: 0.4 10*3/uL (ref 0.1–1.0)
Monocytes Relative: 6.7 % (ref 3.0–12.0)
Neutro Abs: 2.7 10*3/uL (ref 1.4–7.7)
Neutrophils Relative %: 50 % (ref 43.0–77.0)
Platelets: 195 10*3/uL (ref 150.0–400.0)
RBC: 5.25 Mil/uL (ref 4.22–5.81)
RDW: 13.8 % (ref 11.5–15.5)
WBC: 5.4 10*3/uL (ref 4.0–10.5)

## 2019-04-08 NOTE — Patient Instructions (Signed)
It was very nice to see you today!  I think you probably have a reactive lymph node. This is benign. We will check blood work and an ultrasound to make sure there is nothing else going on.  Take care, Dr [name]  Please try these tips to maintain a healthy lifestyle:   Eat at least 3 REAL meals and 1-2 snacks per day.  Aim for no more than 5 hours between eating.  If you eat breakfast, please do so within one hour of getting up.    Each meal should contain half fruits/vegetables, one quarter protein, and one quarter carbs (no bigger than a computer mouse)   Cut down on sweet beverages. This includes juice, soda, and sweet tea.     Drink at least 1 glass of water with each meal and aim for at least 8 glasses per day   Exercise at least 150 minutes every week.

## 2019-04-08 NOTE — Progress Notes (Signed)
   Chief Complaint:  Daniel Gilbert is a 33 y.o. male who presents today with a chief complaint of lump.   Assessment/Plan:  Lump Likely reactive lymph node however given that it has been there for a month we will pursue further work-up.  No red flags.  Will check CBC and C met.  We will also check ultrasound.  Preventative Healthcare Flu vaccine given today.     Subjective:  HPI:  Lump  Started about a month ago. Located on left side of neck. Stable over that time. He has had some throat irritation intermittently for a couple of years that he thought was due to reflux. No fevers or chills. No night sweats. No unintentional weight loss. No obvious precipitating events. Some rhinorrhea. No ear pain.   ROS: Per HPI  PMH: He reports that he has never smoked. He has never used smokeless tobacco. He reports current alcohol use. He reports that he does not use drugs.      Objective:  Physical Exam: BP 122/76   Pulse (!) 59   Temp 98 F (36.7 C)   Ht '5\' 11"'$  (1.803 m)   Wt 182 lb 3.2 oz (82.6 kg)   SpO2 99%   BMI 25.41 kg/m   Gen: NAD, resting comfortably HEENT: Mobile 1 cm mass on left SCM.  Nontender to palpation.  TMs clear.  Slight OP erythema noted.  No other lymphadenopathy.      Algis Greenhouse. [name], MD 04/08/2019 9:11 AM

## 2019-04-08 NOTE — Progress Notes (Signed)
Please inform patient of the following:  Blood work is NORMAL. No obvious explanation for his lump. Will call him once we receive ultrasound results.  Algis Greenhouse. [name], MD 04/08/2019 1:27 PM

## 2019-04-09 ENCOUNTER — Telehealth: Payer: Self-pay

## 2019-04-09 NOTE — Telephone Encounter (Signed)
Copied from [place] [id number]. Topic: General - Other >> Apr 09, 2019  4:29 PM Yvette Rack wrote: Reason for CRM: Pt stated he received a call for lab results. Pt requests call back

## 2019-04-10 NOTE — Telephone Encounter (Signed)
Notified patient of lab results 

## 2019-04-15 ENCOUNTER — Ambulatory Visit
Admission: RE | Admit: 2019-04-15 | Discharge: 2019-04-15 | Disposition: A | Discharge status: Home / Self Care | Payer: 59 | Source: Ambulatory Visit | Attending: Family Medicine | Admitting: Family Medicine

## 2019-04-15 DIAGNOSIS — R221 Localized swelling, mass and lump, neck: Secondary | ICD-10-CM

## 2019-04-16 NOTE — Progress Notes (Signed)
Please inform patient of the following:  His ultrasound was normal. No findings concerning for any problems. Do not need to do any further testing at this time. Recommend that he continue to monitor and let us know if it changes in anyway.

## 2019-04-24 ENCOUNTER — Other Ambulatory Visit: Payer: Self-pay

## 2019-04-24 ENCOUNTER — Encounter: Payer: Self-pay | Admitting: Family Medicine

## 2019-04-24 MED ORDER — ALLOPURINOL 100 MG PO TABS: 100.0000 mg | ORAL_TABLET | Freq: Every day | ORAL | 0 refills | Status: DC

## 2019-04-30 ENCOUNTER — Ambulatory Visit: Payer: 59 | Attending: Internal Medicine

## 2019-04-30 DIAGNOSIS — Z20822 Contact with and (suspected) exposure to covid-19: Secondary | ICD-10-CM

## 2019-05-01 LAB — NOVEL CORONAVIRUS, NAA: SARS-CoV-2, NAA: NOT DETECTED

## 2019-05-25 ENCOUNTER — Ambulatory Visit: Payer: 59

## 2019-06-06 ENCOUNTER — Ambulatory Visit: Payer: 59 | Attending: Internal Medicine

## 2019-06-06 DIAGNOSIS — Z23 Encounter for immunization: Secondary | ICD-10-CM | POA: Insufficient documentation

## 2019-06-06 NOTE — Progress Notes (Signed)
   Covid-19 Vaccination Clinic  Name:  Daniel Gilbert    MRN: [id number] DOB: 1985-08-07  06/06/2019  [name] was observed post Covid-19 immunization for 15 minutes without incidence. He was provided with Vaccine Information Sheet and instruction to access the V-Safe system.   [name] was instructed to call 911 with any severe reactions post vaccine: Marland Kitchen Difficulty breathing  . Swelling of your face and throat  . A fast heartbeat  . A bad rash all over your body  . Dizziness and weakness    Immunizations Administered    Name Date Dose VIS Date Route   Pfizer COVID-19 Vaccine 06/06/2019  4:50 PM 0.3 mL 03/22/2019 Intramuscular   Manufacturer: [employer], [employer]   Lot: [id number]   NDC: [address]-9

## 2019-06-26 ENCOUNTER — Other Ambulatory Visit: Payer: Self-pay | Admitting: Family Medicine

## 2019-06-26 MED ORDER — ALLOPURINOL 100 MG PO TABS: 100.0000 mg | ORAL_TABLET | Freq: Every day | ORAL | 0 refills | Status: DC

## 2019-07-02 ENCOUNTER — Ambulatory Visit: Payer: 59 | Attending: Internal Medicine

## 2019-07-02 DIAGNOSIS — Z23 Encounter for immunization: Secondary | ICD-10-CM

## 2019-07-02 NOTE — Progress Notes (Signed)
   Covid-19 Vaccination Clinic  Name:  Daniel Gilbert    MRN: [id number] DOB: 28-Feb-1986  07/02/2019  [name] was observed post Covid-19 immunization for 15 minutes without incident. He was provided with Vaccine Information Sheet and instruction to access the V-Safe system.   [name] was instructed to call 911 with any severe reactions post vaccine: Marland Kitchen Difficulty breathing  . Swelling of face and throat  . A fast heartbeat  . A bad rash all over body  . Dizziness and weakness   Immunizations Administered    Name Date Dose VIS Date Route   Pfizer COVID-19 Vaccine 07/02/2019  1:26 PM 0.3 mL 03/22/2019 Intramuscular   Manufacturer: [employer], [employer]   Lot: [id number]   NDC: [address]-6

## 2019-07-19 ENCOUNTER — Other Ambulatory Visit: Payer: Self-pay | Admitting: Family Medicine

## 2019-08-11 ENCOUNTER — Other Ambulatory Visit: Payer: Self-pay | Admitting: Family Medicine

## 2019-08-12 ENCOUNTER — Other Ambulatory Visit: Payer: Self-pay | Admitting: Family Medicine

## 2019-08-12 MED ORDER — ALLOPURINOL 100 MG PO TABS: 100.0000 mg | ORAL_TABLET | Freq: Every day | ORAL | 0 refills | Status: DC

## 2019-08-30 ENCOUNTER — Encounter: Payer: Self-pay | Admitting: Family Medicine

## 2019-08-30 ENCOUNTER — Ambulatory Visit: Payer: 59 | Admitting: Family Medicine

## 2019-08-30 ENCOUNTER — Other Ambulatory Visit: Payer: Self-pay

## 2019-08-30 VITALS — BP 110/70 | HR 68 | Temp 98.2°F | Ht 71.0 in | Wt 184.0 lb

## 2019-08-30 DIAGNOSIS — M109 Gout, unspecified: Secondary | ICD-10-CM

## 2019-08-30 DIAGNOSIS — J029 Acute pharyngitis, unspecified: Secondary | ICD-10-CM | POA: Insufficient documentation

## 2019-08-30 NOTE — Assessment & Plan Note (Signed)
Stable. Continue Mobic as needed. Continue allopurinol daily. Check uric acid level with next blood draw.

## 2019-08-30 NOTE — Assessment & Plan Note (Signed)
Will place referral to ENT given chronicity of symptoms.

## 2019-08-30 NOTE — Progress Notes (Signed)
   Daniel Gilbert is a 34 y.o. male who presents today for an office visit.  Assessment/Plan:  Chronic Problems Addressed Today: Sore throat Will place referral to ENT given chronicity of symptoms.  Gout Stable. Continue Mobic as needed. Continue allopurinol daily. Check uric acid level with next blood draw.     Subjective:  HPI:  Patient here for recurrent sore throat. Symptoms started almost 3 years ago. At that time we started Astelin and PPI. Symptoms improved. About 6 months ago had recurrence of symptoms. Also felt a lump in his neck at that time. We obtained an ultrasound which was negative. Symptoms have waxed and waned since then. He would like to be referred to see an ear nose and throat doctor. No reported hoarse voice. No reported fevers or chills. No reported dysphagia.  He has been taking allopurinol 50 mg daily and tolerating well. Usually will take the meloxicam as needed which months out any signs of gout flare.       Objective:  Physical Exam: BP 110/70 (BP Location: Left Arm, Patient Position: Sitting, Cuff Size: Normal)   Pulse 68   Temp 98.2 F (36.8 C) (Temporal)   Ht 5\' 11"  (1.803 m)   Wt 184 lb (83.5 kg)   SpO2 99%   BMI 25.66 kg/m   Gen: No acute distress, resting comfortably HEENT: TM clear. Oropharynx slightly erythematous with no exudate. CV: Regular rate and rhythm with no murmurs appreciated Pulm: Normal work of breathing, clear to auscultation bilaterally with no crackles, wheezes, or rhonchi Neuro: Grossly normal, moves all extremities Psych: Normal affect and thought content      Daniel Gilbert M. , MD 08/30/2019 2:43 PM

## 2019-08-30 NOTE — Patient Instructions (Addendum)
It was very nice to see you today!  I will place a referral for you to see the ear nose and throat doctor.  Please uses meloxicam as needed.  Please let us know if you do not hear anything about your referral by next week.  Take care, Dr Jimmey Ralph  Please try these tips to maintain a healthy lifestyle:   Eat at least 3 REAL meals and 1-2 snacks per day.  Aim for no more than 5 hours between eating.  If you eat breakfast, please do so within one hour of getting up.    Each meal should contain half fruits/vegetables, one quarter protein, and one quarter carbs (no bigger than a computer mouse)   Cut down on sweet beverages. This includes juice, soda, and sweet tea.     Drink at least 1 glass of water with each meal and aim for at least 8 glasses per day   Exercise at least 150 minutes every week.

## 2019-10-07 ENCOUNTER — Other Ambulatory Visit: Payer: Self-pay | Admitting: Family Medicine

## 2019-10-08 MED ORDER — MELOXICAM 15 MG PO TABS: 15.0000 mg | ORAL_TABLET | Freq: Every day | ORAL | 0 refills | Status: DC

## 2019-10-08 MED ORDER — ALLOPURINOL 100 MG PO TABS: 100.0000 mg | ORAL_TABLET | Freq: Every day | ORAL | 0 refills | Status: DC

## 2019-11-03 ENCOUNTER — Other Ambulatory Visit: Payer: Self-pay | Admitting: Family Medicine

## 2019-11-20 ENCOUNTER — Other Ambulatory Visit: Payer: Self-pay | Admitting: Family Medicine

## 2019-11-20 MED ORDER — ALLOPURINOL 100 MG PO TABS: 100.0000 mg | ORAL_TABLET | Freq: Every day | ORAL | 0 refills | Status: DC

## 2019-11-25 ENCOUNTER — Other Ambulatory Visit: Payer: Self-pay

## 2019-11-25 ENCOUNTER — Encounter: Payer: Self-pay | Admitting: Family Medicine

## 2019-11-25 ENCOUNTER — Ambulatory Visit (INDEPENDENT_AMBULATORY_CARE_PROVIDER_SITE_OTHER): Payer: 59 | Admitting: Family Medicine

## 2019-11-25 VITALS — BP 129/81 | HR 70 | Temp 98.2°F | Ht 71.0 in | Wt 185.2 lb

## 2019-11-25 DIAGNOSIS — E785 Hyperlipidemia, unspecified: Secondary | ICD-10-CM | POA: Diagnosis not present

## 2019-11-25 DIAGNOSIS — Z0001 Encounter for general adult medical examination with abnormal findings: Secondary | ICD-10-CM

## 2019-11-25 DIAGNOSIS — E663 Overweight: Secondary | ICD-10-CM

## 2019-11-25 DIAGNOSIS — K219 Gastro-esophageal reflux disease without esophagitis: Secondary | ICD-10-CM | POA: Diagnosis not present

## 2019-11-25 DIAGNOSIS — M109 Gout, unspecified: Secondary | ICD-10-CM

## 2019-11-25 DIAGNOSIS — R739 Hyperglycemia, unspecified: Secondary | ICD-10-CM | POA: Insufficient documentation

## 2019-11-25 DIAGNOSIS — M79674 Pain in right toe(s): Secondary | ICD-10-CM

## 2019-11-25 DIAGNOSIS — Z6825 Body mass index (BMI) 25.0-25.9, adult: Secondary | ICD-10-CM

## 2019-11-25 NOTE — Patient Instructions (Addendum)
It was very nice to see you today!  We will place a referral for you to see the sports medicine for your toe.   We will check blood work today.   I will see you back in a year or sooner if needed.  Take care, Dr [name]  Please try these tips to maintain a healthy lifestyle:   Eat at least 3 REAL meals and 1-2 snacks per day.  Aim for no more than 5 hours between eating.  If you eat breakfast, please do so within one hour of getting up.    Each meal should contain half fruits/vegetables, one quarter protein, and one quarter carbs (no bigger than a computer mouse)   Cut down on sweet beverages. This includes juice, soda, and sweet tea.     Drink at least 1 glass of water with each meal and aim for at least 8 glasses per day   Exercise at least 150 minutes every week.    Preventive Care 50-81 Years Old, Male Preventive care refers to lifestyle choices and visits with your health care provider that can promote health and wellness. This includes:  A yearly physical exam. This is also called an annual well check.  Regular dental and eye exams.  Immunizations.  Screening for certain conditions.  Healthy lifestyle choices, such as eating a healthy diet, getting regular exercise, not using drugs or products that contain nicotine and tobacco, and limiting alcohol use. What can I expect for my preventive care visit? Physical exam Your health care provider will check:  Height and weight. These may be used to calculate body mass index (BMI), which is a measurement that tells if you are at a healthy weight.  Heart rate and blood pressure.  Your skin for abnormal spots. Counseling Your health care provider may ask you questions about:  Alcohol, tobacco, and drug use.  Emotional well-being.  Home and relationship well-being.  Sexual activity.  Eating habits.  Work and work Statistician. What immunizations do I need?  Influenza (flu) vaccine  This is recommended  every year. Tetanus, diphtheria, and pertussis (Tdap) vaccine  You may need a Td booster every 10 years. Varicella (chickenpox) vaccine  You may need this vaccine if you have not already been vaccinated. Human papillomavirus (HPV) vaccine  If recommended by your health care provider, you may need three doses over 6 months. Measles, mumps, and rubella (MMR) vaccine  You may need at least one dose of MMR. You may also need a second dose. Meningococcal conjugate (MenACWY) vaccine  One dose is recommended if you are 52-33 years old and a Market researcher living in a residence hall, or if you have one of several medical conditions. You may also need additional booster doses. Pneumococcal conjugate (PCV13) vaccine  You may need this if you have certain conditions and were not previously vaccinated. Pneumococcal polysaccharide (PPSV23) vaccine  You may need one or two doses if you smoke cigarettes or if you have certain conditions. Hepatitis A vaccine  You may need this if you have certain conditions or if you travel or work in places where you may be exposed to hepatitis A. Hepatitis B vaccine  You may need this if you have certain conditions or if you travel or work in places where you may be exposed to hepatitis B. Haemophilus influenzae type b (Hib) vaccine  You may need this if you have certain risk factors. You may receive vaccines as individual doses or as more than one  vaccine together in one shot (combination vaccines). Talk with your health care provider about the risks and benefits of combination vaccines. What tests do I need? Blood tests  Lipid and cholesterol levels. These may be checked every 5 years starting at age 73.  Hepatitis C test.  Hepatitis B test. Screening   Diabetes screening. This is done by checking your blood sugar (glucose) after you have not eaten for a while (fasting).  Sexually transmitted disease (STD) testing. Talk with your health  care provider about your test results, treatment options, and if necessary, the need for more tests. Follow these instructions at home: Eating and drinking   Eat a diet that includes fresh fruits and vegetables, whole grains, lean protein, and low-fat dairy products.  Take vitamin and mineral supplements as recommended by your health care provider.  Do not drink alcohol if your health care provider tells you not to drink.  If you drink alcohol: ? Limit how much you have to 0-2 drinks a day. ? Be aware of how much alcohol is in your drink. In the [place] one drink equals one 12 oz bottle of beer (355 mL), one 5 oz glass of wine (148 mL), or one 1 oz glass of hard liquor (44 mL). Lifestyle  Take daily care of your teeth and gums.  Stay active. Exercise for at least 30 minutes on 5 or more days each week.  Do not use any products that contain nicotine or tobacco, such as cigarettes, e-cigarettes, and chewing tobacco. If you need help quitting, ask your health care provider.  If you are sexually active, practice safe sex. Use a condom or other form of protection to prevent STIs (sexually transmitted infections). What's next?  Go to your health care provider once a year for a well check visit.  Ask your health care provider how often you should have your eyes and teeth checked.  Stay up to date on all vaccines. This information is not intended to replace advice given to you by your health care provider. Make sure you discuss any questions you have with your health care provider. Document Revised: 03/22/2018 Document Reviewed: 03/22/2018 Elsevier Patient Education  2020 [employer].

## 2019-11-25 NOTE — Assessment & Plan Note (Addendum)
Continue famotidine 20 mg nightly. Diagnosed by ENT. Improving with famotidine 20mg  nightly.

## 2019-11-25 NOTE — Assessment & Plan Note (Addendum)
No recent flares. Will check uric acid level today. Continue allopurinol 100mg  daily.

## 2019-11-25 NOTE — Assessment & Plan Note (Signed)
Check A1c. 

## 2019-11-25 NOTE — Assessment & Plan Note (Signed)
Check lipid panel, CBC, CMET, TSH. 

## 2019-11-25 NOTE — Progress Notes (Signed)
Chief Complaint:  Daniel Gilbert is a 34 y.o. male who presents today for his annual comprehensive physical exam.    Assessment/Plan:  New/Acute Problems: Right Great Toe Pain Has gout and likely some component of hallux rigidus. Will place referral to sports medicine. Continue Mobic as needed.  Chronic Problems Addressed Today: GERD (gastroesophageal reflux disease) Continue famotidine 20 mg nightly. Diagnosed by ENT. Improving with famotidine 20mg  nightly.   Gout No recent flares. Will check uric acid level today. Continue allopurinol 100mg  daily.   Hyperglycemia Check A1c.  Dyslipidemia Check lipid panel, CBC, CMET, TSH.   Body mass index is 25.83 kg/m. / Overweight  BMI Metric Follow Up - 11/25/19 1424      BMI Metric Follow Up-Please document annually   BMI Metric Follow Up Education provided            Preventative Healthcare: Check CBC, CMET, TSH. Has received both Covid vaccines.  Patient Counseling(The following topics were reviewed and/or handout was given):  -Nutrition: Stressed importance of moderation in sodium/caffeine intake, saturated fat and cholesterol, caloric balance, sufficient intake of fresh fruits, vegetables, and fiber.  -Stressed the importance of regular exercise.   -Substance Abuse: Discussed cessation/primary prevention of tobacco, alcohol, or other drug use; driving or other dangerous activities under the influence; availability of treatment for abuse.   -Injury prevention: Discussed safety belts, safety helmets, smoke detector, smoking near bedding or upholstery.   -Sexuality: Discussed sexually transmitted diseases, partner selection, use of condoms, avoidance of unintended pregnancy and contraceptive alternatives.   -Dental health: Discussed importance of regular tooth brushing, flossing, and dental visits.  -Health maintenance and immunizations reviewed. Please refer to Health maintenance section.  Return to care in 1 year for  next preventative visit.     Subjective:  HPI:  He has no acute complaints today.   Lifestyle Diet: "Hit or miss" Exercise: Likes playing surgery.   Depression screen PHQ 2/9 11/25/2019  Decreased Interest 0  Down, Depressed, Hopeless 0  PHQ - 2 Score 0    Health Maintenance Due  Topic Date Due  . Hepatitis C Screening  Never done     ROS: Per HPI, otherwise a complete review of systems was negative.   PMH:  The following were reviewed and entered/updated in epic: Past Medical History:  Diagnosis Date  . Hyperlipidemia    Patient Active Problem List   Diagnosis Date Noted  . GERD (gastroesophageal reflux disease) 11/25/2019  . Hyperglycemia 11/25/2019  . Gout 05/29/2017  . Dyslipidemia 03/07/2014   Past Surgical History:  Procedure Laterality Date  . CLAVICLE SURGERY Right   . ROTATOR CUFF REPAIR Right     Family History  Problem Relation Age of Onset  . Depression Mother   . Heart attack Maternal Grandfather   . Hyperlipidemia Maternal Grandfather   . Stroke Maternal Grandfather   . Alcohol abuse Paternal Grandfather   . Cancer Paternal Grandfather     Medications- reviewed and updated Current Outpatient Medications  Medication Sig Dispense Refill  . allopurinol (ZYLOPRIM) 100 MG tablet Take 1 tablet (100 mg total) by mouth daily. 30 tablet 0  . ibuprofen (ADVIL,MOTRIN) 200 MG tablet Take 200 mg by mouth every 8 (eight) hours as needed.    . meloxicam (MOBIC) 15 MG tablet TAKE 1 TABLET BY MOUTH EVERY DAY 30 tablet 0  . famotidine (PEPCID) 20 MG tablet Take 20 mg by mouth at bedtime.     No current facility-administered medications for this visit.  Allergies-reviewed and updated No Known Allergies  Social History   Socioeconomic History  . Marital status: Married    Spouse name: Not on file  . Number of children: 1  . Years of education: Not on file  . Highest education level: Not on file  Occupational History  . Occupation: Production designer, theatre/television/film    Tobacco Use  . Smoking status: Never Smoker  . Smokeless tobacco: Never Used  Vaping Use  . Vaping Use: Never used  Substance and Sexual Activity  . Alcohol use: Yes  . Drug use: No  . Sexual activity: Yes  Other Topics Concern  . Not on file  Social History Narrative  . Not on file   Social Determinants of Health   Financial Resource Strain:   . Difficulty of Paying Living Expenses:   Food Insecurity:   . Worried About Programme researcher, broadcasting/film/video in the Last Year:   . Barista in the Last Year:   Transportation Needs:   . Freight forwarder (Medical):   Marland Kitchen Lack of Transportation (Non-Medical):   Physical Activity:   . Days of Exercise per Week:   . Minutes of Exercise per Session:   Stress:   . Feeling of Stress :   Social Connections:   . Frequency of Communication with Friends and Family:   . Frequency of Social Gatherings with Friends and Family:   . Attends Religious Services:   . Active Member of Clubs or Organizations:   . Attends Banker Meetings:   Marland Kitchen Marital Status:         Objective:  Physical Exam: BP 129/81   Pulse 70   Temp 98.2 F (36.8 C)   Ht 5\' 11"  (1.803 m)   Wt 185 lb 3.2 oz (84 kg)   SpO2 98%   BMI 25.83 kg/m   Body mass index is 25.83 kg/m. Wt Readings from Last 3 Encounters:  11/25/19 185 lb 3.2 oz (84 kg)  08/30/19 184 lb (83.5 kg)  04/08/19 182 lb 3.2 oz (82.6 kg)   Gen: NAD, resting comfortably HEENT: TMs normal bilaterally. OP clear. No thyromegaly noted.  CV: RRR with no murmurs appreciated Pulm: NWOB, CTAB with no crackles, wheezes, or rhonchi GI: Normal bowel sounds present. Soft, Nontender, Nondistended. MSK: no edema, cyanosis, or clubbing noted Skin: warm, dry Neuro: CN2-12 grossly intact. Strength 5/5 in upper and lower extremities. Reflexes symmetric and intact bilaterally.  Psych: Normal affect and thought content     Daniel Gilbert M. 04/10/19, MD 11/25/2019 2:28 PM

## 2019-11-26 LAB — HEMOGLOBIN A1C
Hgb A1c MFr Bld: 5.1 % of total Hgb (ref <5.7)
Mean Plasma Glucose: 100 (calc)
eAG (mmol/L): 5.5 (calc)

## 2019-11-26 LAB — COMPREHENSIVE METABOLIC PANEL
AG Ratio: 1.7 (calc) (ref 1.0–2.5)
ALT: 26 U/L (ref 9–46)
AST: 26 U/L (ref 10–40)
Albumin: 4.7 g/dL (ref 3.6–5.1)
Alkaline phosphatase (APISO): 36 U/L (ref 36–130)
BUN: 17 mg/dL (ref 7–25)
CO2: 26 mmol/L (ref 20–32)
Calcium: 9.5 mg/dL (ref 8.6–10.3)
Chloride: 101 mmol/L (ref 98–110)
Creat: 1.25 mg/dL (ref 0.60–1.35)
Globulin: 2.8 g/dL (calc) (ref 1.9–3.7)
Glucose, Bld: 86 mg/dL (ref 65–99)
Potassium: 4 mmol/L (ref 3.5–5.3)
Sodium: 139 mmol/L (ref 135–146)
Total Bilirubin: 0.5 mg/dL (ref 0.2–1.2)
Total Protein: 7.5 g/dL (ref 6.1–8.1)

## 2019-11-26 LAB — CBC
HCT: 44.6 % (ref 38.5–50.0)
Hemoglobin: 15.2 g/dL (ref 13.2–17.1)
MCH: 30.2 pg (ref 27.0–33.0)
MCHC: 34.1 g/dL (ref 32.0–36.0)
MCV: 88.7 fL (ref 80.0–100.0)
MPV: 11 fL (ref 7.5–12.5)
Platelets: 198 10*3/uL (ref 140–400)
RBC: 5.03 10*6/uL (ref 4.20–5.80)
RDW: 13.1 % (ref 11.0–15.0)
WBC: 6 10*3/uL (ref 3.8–10.8)

## 2019-11-26 LAB — LIPID PANEL
Cholesterol: 268 mg/dL — ABNORMAL HIGH (ref <200)
HDL: 64 mg/dL (ref >=40)
LDL Cholesterol (Calc): 173 mg/dL (calc) — ABNORMAL HIGH
Non-HDL Cholesterol (Calc): 204 mg/dL (calc) — ABNORMAL HIGH (ref <130)
Total CHOL/HDL Ratio: 4.2 (calc) (ref <5.0)
Triglycerides: 166 mg/dL — ABNORMAL HIGH (ref <150)

## 2019-11-26 LAB — TSH: TSH: 1.33 mIU/L (ref 0.40–4.50)

## 2019-11-26 NOTE — Progress Notes (Signed)
Please inform patient of the following:  Cholesterol a bit high but stable. Everything else is NORMAL. Do not need to start any medications but he should continue to work on diet and exercise and we can recheck in a year.  Katina Degree. Jimmey Ralph, MD 11/26/2019 2:52 PM

## 2019-12-06 ENCOUNTER — Ambulatory Visit: Payer: 59 | Admitting: Family Medicine

## 2019-12-06 ENCOUNTER — Encounter: Payer: Self-pay | Admitting: Family Medicine

## 2019-12-06 ENCOUNTER — Other Ambulatory Visit: Payer: Self-pay

## 2019-12-06 ENCOUNTER — Ambulatory Visit: Payer: Self-pay

## 2019-12-06 ENCOUNTER — Other Ambulatory Visit: Payer: Self-pay | Admitting: *Deleted

## 2019-12-06 VITALS — BP 110/84 | HR 61 | Ht 71.0 in | Wt 186.2 lb

## 2019-12-06 DIAGNOSIS — M79674 Pain in right toe(s): Secondary | ICD-10-CM

## 2019-12-06 DIAGNOSIS — M1A0710 Idiopathic chronic gout, right ankle and foot, without tophus (tophi): Secondary | ICD-10-CM

## 2019-12-06 DIAGNOSIS — E785 Hyperlipidemia, unspecified: Secondary | ICD-10-CM

## 2019-12-06 MED ORDER — ATORVASTATIN CALCIUM 20 MG PO TABS: 20.0000 mg | ORAL_TABLET | Freq: Every day | ORAL | 3 refills | Status: DC

## 2019-12-06 NOTE — Patient Instructions (Addendum)
Thank you for coming in today. Get labs today.  I will adjust your allopurinol as needed.  Consider Krill oil high in Omega 3.  Looking for around 1000mg  daily or twice daily.   Consider turf toe insole as needed.   Get a Steel Turf Toe insole.  Do a for Microbiologist

## 2019-12-06 NOTE — Telephone Encounter (Signed)
Rx send to pharmacy, pt aware

## 2019-12-06 NOTE — Progress Notes (Signed)
Subjective:    I'm seeing this patient as a consultation for:  [name]. Note will be routed back to referring provider/PCP.  CC: R great toe / gout  I, Daniel Gilbert, LAT, ATC, am serving as scribe for Dr. [name].  HPI: Pt is a 34 y/o male presenting w/ c/o R great toe pain and swelling.  He has a hx of gout over the past 3-4 years.  He takes Allopurinol.  His biggest issue is that his R great toe con't to be swollen.  He reports decreased R great toe ROM.  Radiating pain: No R great toe swelling: yes Aggravating factors: different foods (pork in particular); sport activity like playing soccer or basketball Treatments tried:  Allopurinol; Meloxicam prn  Past medical history, Surgical history, Family history, Social history, Allergies, and medications have been entered into the medical record, reviewed.  Positive family history for gout and hyperlipidemia.  Review of Systems: No new headache, visual changes, nausea, vomiting, diarrhea, constipation, dizziness, abdominal pain, skin rash, fevers, chills, night sweats, weight loss, swollen lymph nodes, body aches, joint swelling, muscle aches, chest pain, shortness of breath, mood changes, visual or auditory hallucinations.   Objective:    Vitals:   12/06/19 1102  BP: 110/84  Pulse: 61  SpO2: 98%   General: Well Developed, well nourished, and in no acute distress.  Neuro/Psych: Alert and oriented x3, extra-ocular muscles intact, able to move all 4 extremities, sensation grossly intact. Skin: Warm and dry, no rashes noted.  Respiratory: Not using accessory muscles, speaking in full sentences, trachea midline.  Cardiovascular: Pulses palpable, no extremity edema. Abdomen: Does not appear distended. MSK: Right toe normal-appearing.  Decreased motion to dorsiflexion plantarflexion.  Nontender.  Lab and Radiology Results Lab Results  Component Value Date   LABURIC 8.8 (H) 10/09/2018    Impression and Recommendations:     Assessment and Plan: 34 y.o. male with chronic gout with periodic flareups typically involving the right great toe.  Currently doing pretty well with allopurinol 100 mg daily.  Plan to recheck uric acid to adjust allopurinol if needed.  Goal should be uric acid less than 6.0.  Discussed that the best way to prevent further damage to his toe joint is to prevent further gout flares.  If needed steel turf toe insoles could be helpful for hallux rigidus.  Additionally patient asked about his cholesterol.  His LDL is a little elevated at 173 and he has a positive family history for hyperlipidemia.  Advised him that statins may be in his future but it is reasonable to try omega-3 fatty acids for now and okay to ask his PCP about statins if he is interested in them.Marland Kitchen  PDMP not reviewed this encounter. Orders Placed This Encounter  Procedures  . [place] LIMITED JOINT SPACE STRUCTURES LOW RIGHT(NO LINKED CHARGES)    Order Specific Question:   Reason for Exam (SYMPTOM  OR DIAGNOSIS REQUIRED)    Answer:   R great toe swelling    Order Specific Question:   Preferred imaging location?    Answer:   [facility]  . Uric acid    Standing Status:   Future    Number of Occurrences:   1    Standing Expiration Date:   12/05/2020   No orders of the defined types were placed in this encounter.   Discussed warning signs or symptoms. Please see discharge instructions. Patient expresses understanding.   The above documentation has been reviewed and is  accurate and complete Daniel Gilbert, M.D.

## 2019-12-07 LAB — URIC ACID: Uric Acid, Serum: 6.9 mg/dL (ref 4.0–8.0)

## 2019-12-09 MED ORDER — ALLOPURINOL 300 MG PO TABS
300.0000 mg | ORAL_TABLET | Freq: Every day | ORAL | 1 refills | Status: DC
Start: 2019-12-09 — End: 2020-06-04

## 2019-12-09 NOTE — Addendum Note (Signed)
Addended by: Rodolph Bong on: 12/09/2019 02:47 PM   Modules accepted: Orders

## 2019-12-09 NOTE — Progress Notes (Signed)
Uric acid is higher than we would like at 6.9. Plan to increase allopurinol to 300mg  daily. This will help much more to prevent gout.  Should recheck labs in about 3 months.  Medicine sent to [facility] [address]

## 2019-12-10 ENCOUNTER — Other Ambulatory Visit: Payer: Self-pay | Admitting: Physical Therapy

## 2019-12-10 DIAGNOSIS — M1A0710 Idiopathic chronic gout, right ankle and foot, without tophus (tophi): Secondary | ICD-10-CM

## 2020-03-13 NOTE — Progress Notes (Signed)
   I, Christoper Fabian, LAT, ATC, am serving as scribe for Dr. Clementeen Graham.  Daniel Gilbert is a 34 y.o. male who presents to [facility] today for f/u of R great toe pain and swelling.  Pt was last seen by Dr. Denyse Amass on 12/06/19 and had labs to check for his uric acid level.  He was also advised to purchase a steel shoe insert to help decrease motion in his R great toe.  Since his last visit w/ Dr. Denyse Amass, pt reports that his R great toe remains about the same.  He did not get any type of insert for his shoes.  He states that the only time he has pain is when he really puts pressure on the most irritated spot on his toe.  He has no pain w/ walking or w/ great toe extension.  He has been taking his Allopurinol regularly.  He has also more recently started cholesterol medication and is wondering if this might have anything to do w/ his toe pain.  Additionally he was started on atorvastatin over the last several months and has increased muscle soreness and joint soreness.  He is not sure if it is the medicine or not.   Pertinent review of systems: No fevers or chills  Relevant historical information: Gout and hyperlipidemia   Exam:  BP 130/78 (BP Location: Right Arm, Patient Position: Sitting, Cuff Size: Normal)   Pulse 71   Ht 5\' 11"  (1.803 m)   Wt 189 lb (85.7 kg)   SpO2 99%   BMI 26.36 kg/m  General: Well Developed, well nourished, and in no acute distress.   MSK: Right great toe decreased motion otherwise normal-appearing nontender.      Assessment and Plan: 34 y.o. male with gout.  No exacerbations on allopurinol.  Uric acid pending.  Will adjust as needed however doubtful need to based on most recent uric acid.  Goal less than 6.  If all is well recheck as needed.  Muscle soreness thought to be due to possibly statin myalgia.  Recommend trying to stop the atorvastatin for a few weeks and then restarting.  If this pain gets better off of it and  returns on it that we can assume that his statin is causing the muscle pain.  It does not change much and it probably is not. Consider Crestor or Pravastatin or even Livalo if needed.    PDMP not reviewed this encounter. Orders Placed This Encounter  Procedures  . Uric acid    Standing Status:   Future    Number of Occurrences:   1    Standing Expiration Date:   03/16/2021   No orders of the defined types were placed in this encounter.    Discussed warning signs or symptoms. Please see discharge instructions. Patient expresses understanding.   The above documentation has been reviewed and is accurate and complete 14/09/2020, M.D.

## 2020-03-16 ENCOUNTER — Encounter: Payer: Self-pay | Admitting: Family Medicine

## 2020-03-16 ENCOUNTER — Other Ambulatory Visit: Payer: Self-pay

## 2020-03-16 ENCOUNTER — Ambulatory Visit: Payer: Self-pay

## 2020-03-16 ENCOUNTER — Ambulatory Visit: Payer: 59 | Admitting: Family Medicine

## 2020-03-16 VITALS — BP 130/78 | HR 71 | Ht 71.0 in | Wt 189.0 lb

## 2020-03-16 DIAGNOSIS — M791 Myalgia, unspecified site: Secondary | ICD-10-CM | POA: Diagnosis not present

## 2020-03-16 DIAGNOSIS — M1A0710 Idiopathic chronic gout, right ankle and foot, without tophus (tophi): Secondary | ICD-10-CM

## 2020-03-16 LAB — URIC ACID: Uric Acid, Serum: 4 mg/dL (ref 4.0–7.8)

## 2020-03-16 NOTE — Progress Notes (Signed)
Uric acid is 4.0.  This is extremely well controlled.  Continue current regimen.

## 2020-03-16 NOTE — Addendum Note (Signed)
Addended by: Waldemar Dickens B on: 03/16/2020 08:08 AM   Modules accepted: Orders

## 2020-03-16 NOTE — Patient Instructions (Signed)
Thank you for coming in today.  GOUT: I will let you know the results of the uric acid now and adjust the medicine if needed.   STATIN: Try stopping for 2-3 weeks and then restarting. If the aches and pains return then we know the problem is due to the medicine and we can change the medicine to usually Crestor or pravastatin.  Eventually will usually use Pitavastatin (Livalo).

## 2020-06-04 ENCOUNTER — Other Ambulatory Visit: Payer: Self-pay | Admitting: Family Medicine

## 2020-06-04 NOTE — Telephone Encounter (Signed)
Rx refill request approved per Dr. Corey's orders. 

## 2020-09-16 ENCOUNTER — Encounter: Payer: Self-pay | Admitting: Registered Nurse

## 2020-09-16 ENCOUNTER — Telehealth (INDEPENDENT_AMBULATORY_CARE_PROVIDER_SITE_OTHER): Payer: 59 | Admitting: Registered Nurse

## 2020-09-16 DIAGNOSIS — J988 Other specified respiratory disorders: Secondary | ICD-10-CM

## 2020-09-16 MED ORDER — ALBUTEROL SULFATE HFA 108 (90 BASE) MCG/ACT IN AERS: 2.0000 | INHALATION_SPRAY | Freq: Four times a day (QID) | RESPIRATORY_TRACT | 0 refills | Status: DC | PRN

## 2020-09-16 MED ORDER — PREDNISONE 10 MG PO TABS: ORAL_TABLET | ORAL | 0 refills | Status: AC

## 2020-09-16 MED ORDER — DOXYCYCLINE HYCLATE 100 MG PO TABS: 100.0000 mg | ORAL_TABLET | Freq: Two times a day (BID) | ORAL | 0 refills | Status: DC

## 2020-09-16 NOTE — Progress Notes (Signed)
Telemedicine Encounter- SOAP NOTE Established Patient  This telephone encounter was conducted with the patient's (or proxy's) verbal consent via audio telecommunications: yes  Patient was instructed to have this encounter in a suitably private space; and to only have persons present to whom they give permission to participate. In addition, patient identity was confirmed by use of name plus two identifiers (DOB and address).  I discussed the limitations, risks, security and privacy concerns of performing an evaluation and management service by telephone and the availability of in person appointments. I also discussed with the patient that there may be a patient responsible charge related to this service. The patient expressed understanding and agreed to proceed.  I spent a total of 15 minutes talking with the patient or their proxy.  Patient at home Provider in office  Participants: Daniel Sportsman, NP and Daniel Gilbert  Chief Complaint  Patient presents with  . Sinusitis    Cough,drainage,facial pressure,chest congestion     Subjective   Daniel Gilbert is a 35 y.o. established patient. Telephone visit today for congestion  HPI Has been sick on and off for a few months -  Most recent course is Augmentin po bid for 10 days on 08/23/20 for sinus pressure, chest congestion. Continued zyrtec for allergies. Did sinus spray. Symptoms improved generally but did not resolve.  Now having thick sputum with cough, sinus congestion and pnd, facial headache, ear pressure. Malaise and fatigue - feels like he is operating at 70%  Has not recently taken COVID test but notes his wife took a couple before she traveled and they were all negative.  Patient Active Problem List   Diagnosis Date Noted  . GERD (gastroesophageal reflux disease) 11/25/2019  . Hyperglycemia 11/25/2019  . Gout 05/29/2017  . Dyslipidemia 03/07/2014    Past Medical History:  Diagnosis Date  . Hyperlipidemia      Current Outpatient Medications  Medication Sig Dispense Refill  . albuterol (VENTOLIN HFA) 108 (90 Base) MCG/ACT inhaler Inhale 2 puffs into the lungs every 6 (six) hours as needed for wheezing or shortness of breath. 8 g 0  . allopurinol (ZYLOPRIM) 300 MG tablet TAKE 1 TABLET BY MOUTH EVERY DAY 30 tablet 5  . atorvastatin (LIPITOR) 20 MG tablet Take 1 tablet (20 mg total) by mouth daily. 30 tablet 3  . doxycycline (VIBRA-TABS) 100 MG tablet Take 1 tablet (100 mg total) by mouth 2 (two) times daily. 14 tablet 0  . ibuprofen (ADVIL,MOTRIN) 200 MG tablet Take 200 mg by mouth every 8 (eight) hours as needed.    . meloxicam (MOBIC) 15 MG tablet TAKE 1 TABLET BY MOUTH EVERY DAY 30 tablet 0  . predniSONE (DELTASONE) 10 MG tablet Take 3 tablets (30 mg total) by mouth daily with breakfast for 3 days, THEN 2 tablets (20 mg total) daily with breakfast for 3 days, THEN 1 tablet (10 mg total) daily with breakfast for 3 days. 18 tablet 0   No current facility-administered medications for this visit.    No Known Allergies  Social History   Socioeconomic History  . Marital status: Married    Spouse name: Not on file  . Number of children: 1  . Years of education: Not on file  . Highest education level: Not on file  Occupational History  . Occupation: Production designer, theatre/television/film  Tobacco Use  . Smoking status: Never Smoker  . Smokeless tobacco: Never Used  Vaping Use  . Vaping Use: Never used  Substance and Sexual Activity  .  Alcohol use: Yes  . Drug use: No  . Sexual activity: Yes  Other Topics Concern  . Not on file  Social History Narrative  . Not on file   Social Determinants of Health   Financial Resource Strain: Not on file  Food Insecurity: Not on file  Transportation Needs: Not on file  Physical Activity: Not on file  Stress: Not on file  Social Connections: Not on file  Intimate Partner Violence: Not on file    ROS Per hpi, otherwise negative  Objective   Vitals as reported by the  patient: There were no vitals filed for this visit.  Daniel Gilbert was seen today for sinusitis.  Diagnoses and all orders for this visit:  Respiratory infection -     doxycycline (VIBRA-TABS) 100 MG tablet; Take 1 tablet (100 mg total) by mouth 2 (two) times daily. -     predniSONE (DELTASONE) 10 MG tablet; Take 3 tablets (30 mg total) by mouth daily with breakfast for 3 days, THEN 2 tablets (20 mg total) daily with breakfast for 3 days, THEN 1 tablet (10 mg total) daily with breakfast for 3 days. -     albuterol (VENTOLIN HFA) 108 (90 Base) MCG/ACT inhaler; Inhale 2 puffs into the lungs every 6 (six) hours as needed for wheezing or shortness of breath.   PLAN  Suspect ongoing bacterial infection. Doxycycline 100mg  PO bid. Prednisone taper. Albuterol PRN  If worsening or failing to improve can consider ENT or pulm referral  Suggest covid testing. If negative we can pursue imaging pending symptoms  Return precautions and ER precautions reviewed with patient who voices understanding  Patient encouraged to call clinic with any questions, comments, or concerns.   I discussed the assessment and treatment plan with the patient. The patient was provided an opportunity to ask questions and all were answered. The patient agreed with the plan and demonstrated an understanding of the instructions.   The patient was advised to call back or seek an in-person evaluation if the symptoms worsen or if the condition fails to improve as anticipated.  I provided 15 minutes of non-face-to-face time during this encounter.  , NP  [facility] at [facility]

## 2020-10-08 ENCOUNTER — Other Ambulatory Visit: Payer: Self-pay | Admitting: Registered Nurse

## 2020-10-08 DIAGNOSIS — J988 Other specified respiratory disorders: Secondary | ICD-10-CM

## 2020-10-24 IMAGING — US US SOFT TISSUE HEAD/NECK
1 series · 14 of 25 positions shown · non-contrast
Comparison: None.

CLINICAL DATA: 33-year-old male with a history of neck mass

EXAM:
ULTRASOUND OF HEAD/NECK SOFT TISSUES
TECHNIQUE: Ultrasound examination of the head and neck soft tissues was
performed in the area of clinical concern.

[Series 1: us soft tissue head/neck · 0.05mm/px · 14 of 25 slices shown]
[im 1/25]
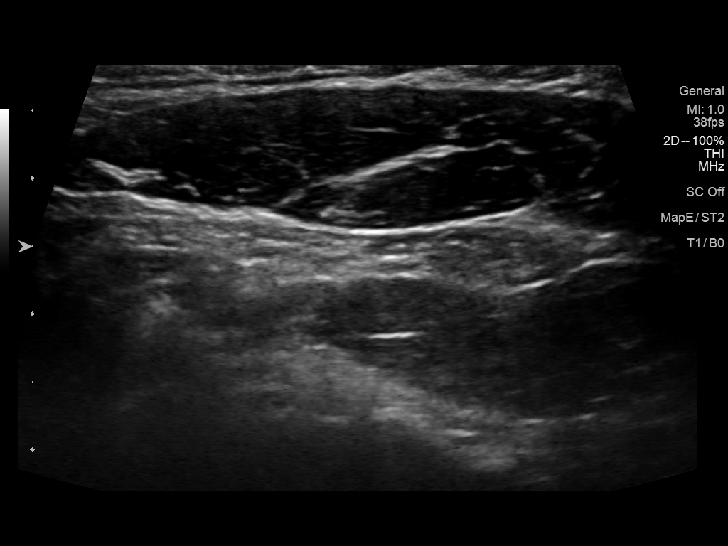
[im 3/25]
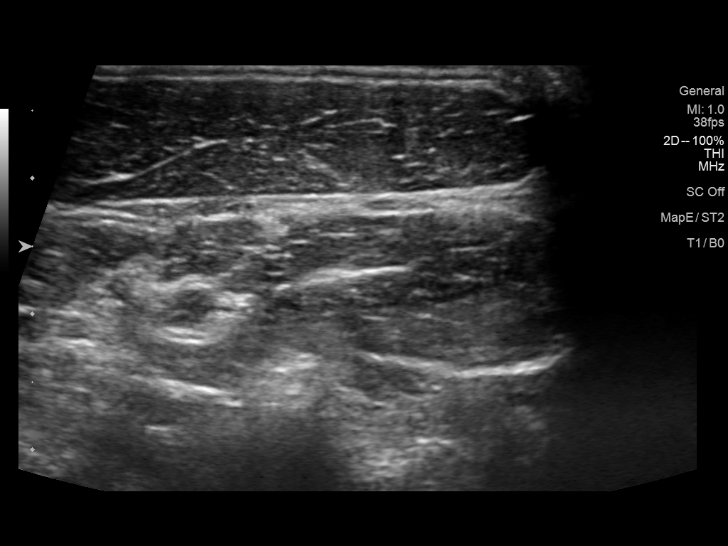
[im 5/25]
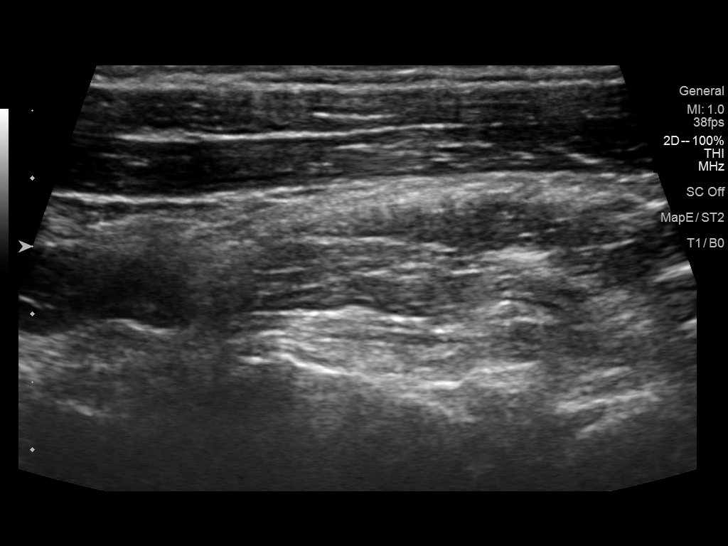
[im 7/25]
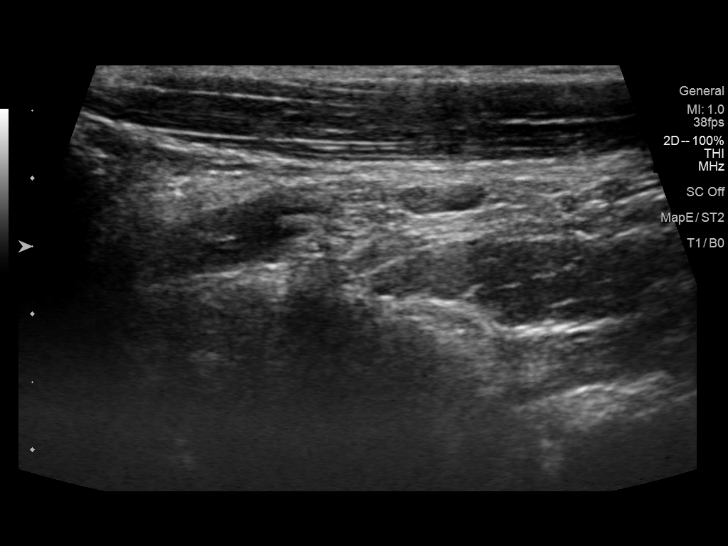
[im 9/25]
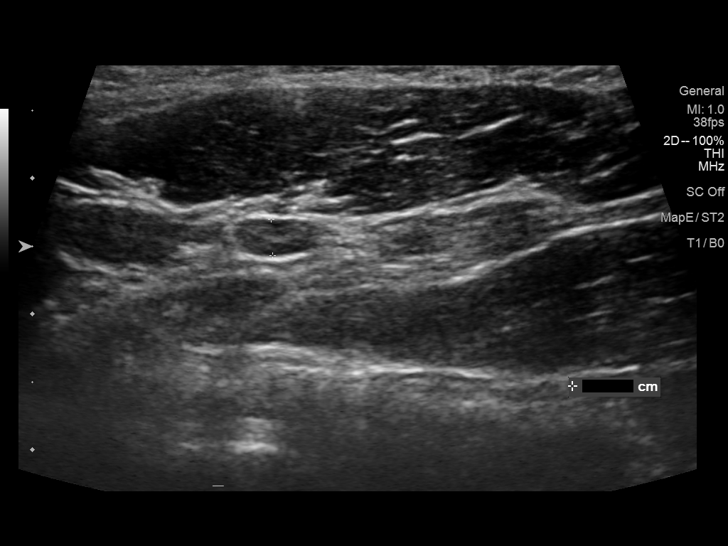
[im 10/25]
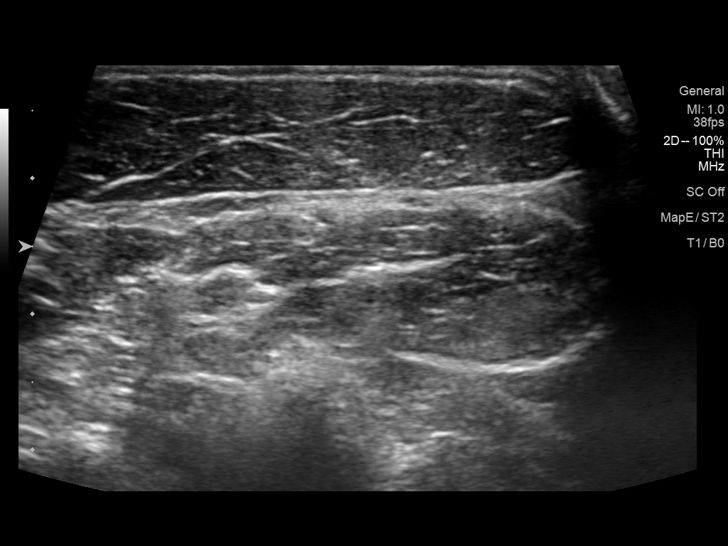
[im 12/25]
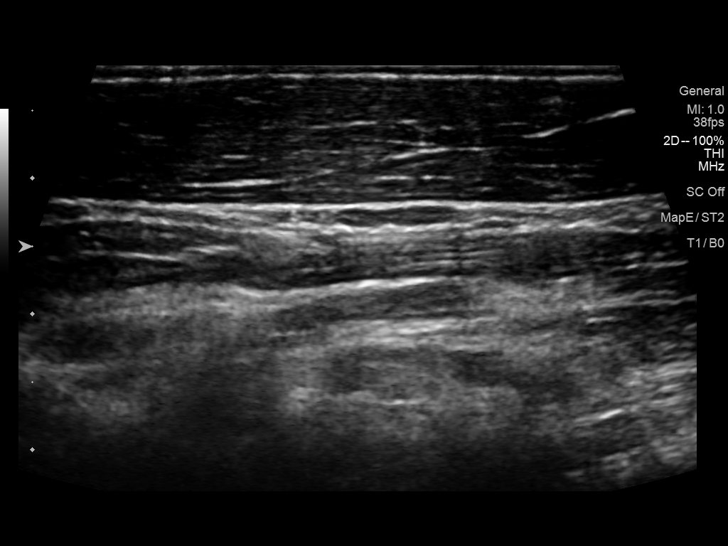
[im 14/25]
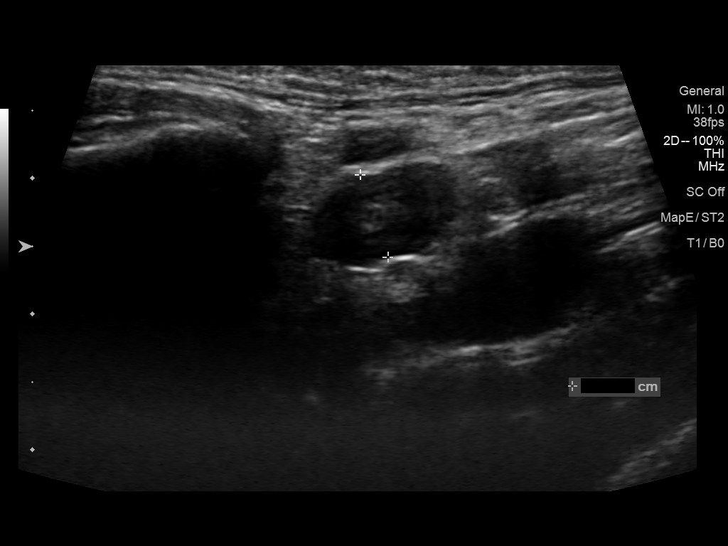
[im 16/25]
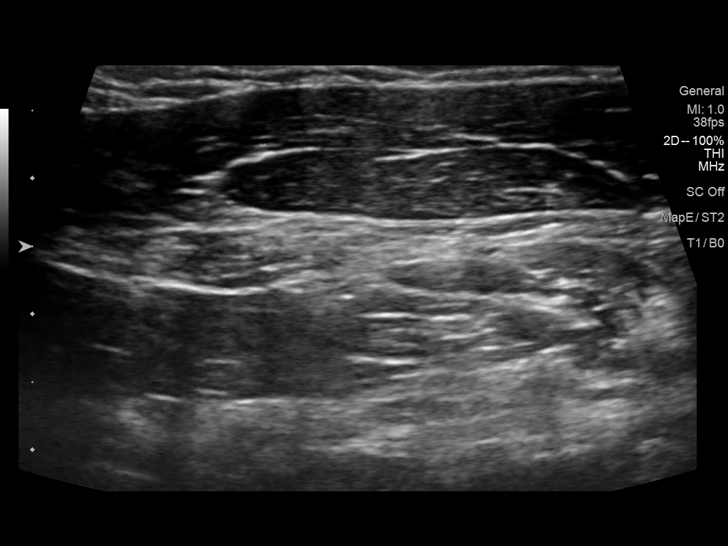
[im 17/25]
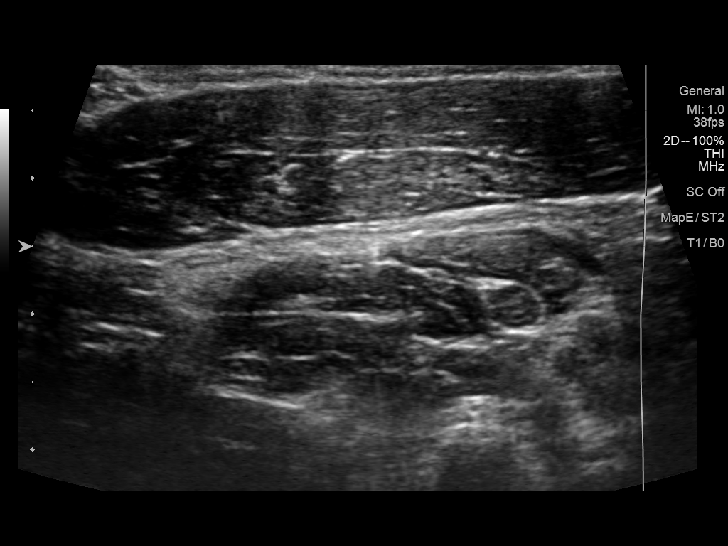
[im 19/25]
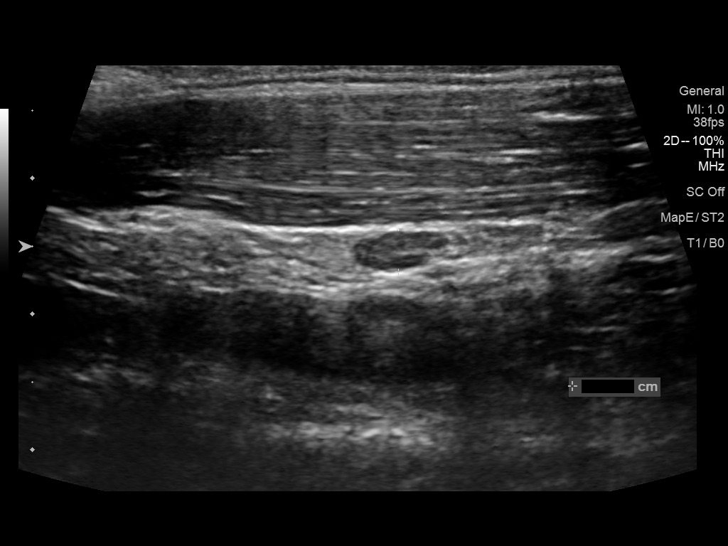
[im 21/25]
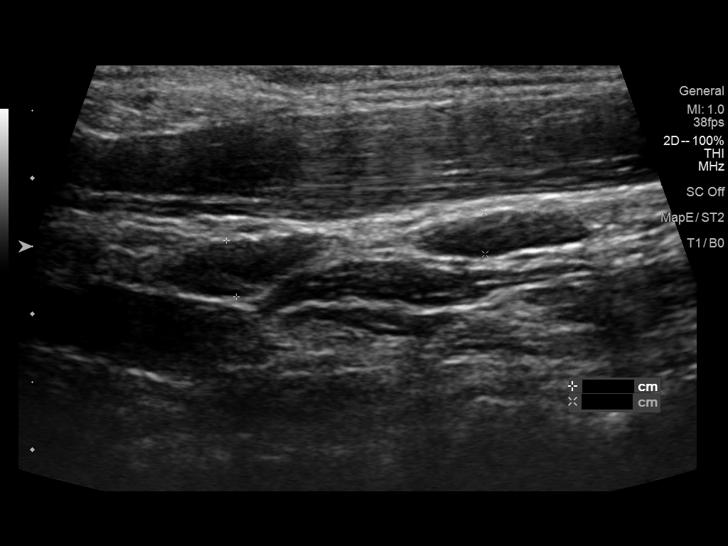
[im 23/25]
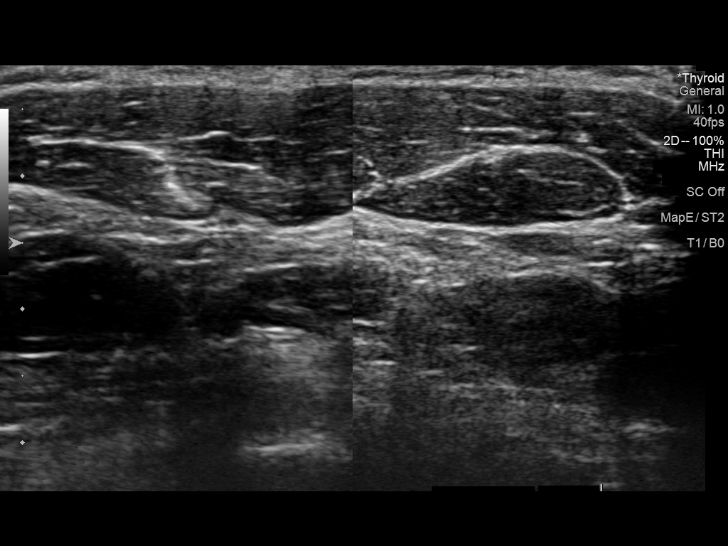
[im 25/25]
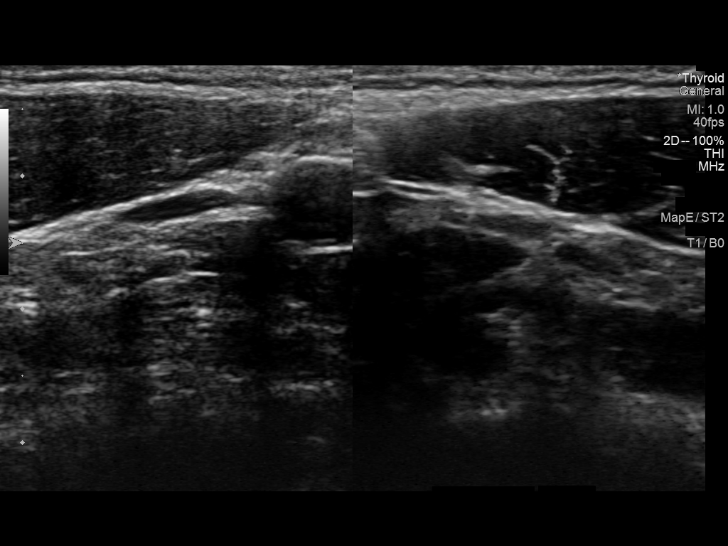

[14 of 25 positions shown; findings below may reference images not displayed]

FINDINGS: Grayscale and color duplex performed in the region of clinical
concern.

No focal fluid.  No adenopathy.  No soft tissue lesion.
IMPRESSION: Unremarkable sonographic survey in the region of clinical concern

## 2020-10-29 ENCOUNTER — Encounter: Payer: Self-pay | Admitting: Family Medicine

## 2020-10-29 MED ORDER — ALLOPURINOL 300 MG PO TABS: 300.0000 mg | ORAL_TABLET | Freq: Every day | ORAL | 1 refills | Status: DC

## 2021-01-21 ENCOUNTER — Other Ambulatory Visit: Payer: Self-pay

## 2021-01-21 ENCOUNTER — Encounter: Payer: Self-pay | Admitting: Family Medicine

## 2021-01-21 ENCOUNTER — Ambulatory Visit (INDEPENDENT_AMBULATORY_CARE_PROVIDER_SITE_OTHER): Payer: 59 | Admitting: Family Medicine

## 2021-01-21 VITALS — BP 121/58 | HR 68 | Temp 98.2°F | Ht 71.0 in | Wt 183.0 lb

## 2021-01-21 DIAGNOSIS — R2230 Localized swelling, mass and lump, unspecified upper limb: Secondary | ICD-10-CM

## 2021-01-21 DIAGNOSIS — R739 Hyperglycemia, unspecified: Secondary | ICD-10-CM

## 2021-01-21 DIAGNOSIS — M109 Gout, unspecified: Secondary | ICD-10-CM | POA: Diagnosis not present

## 2021-01-21 DIAGNOSIS — E785 Hyperlipidemia, unspecified: Secondary | ICD-10-CM | POA: Diagnosis not present

## 2021-01-21 DIAGNOSIS — Z0001 Encounter for general adult medical examination with abnormal findings: Secondary | ICD-10-CM

## 2021-01-21 DIAGNOSIS — E663 Overweight: Secondary | ICD-10-CM

## 2021-01-21 DIAGNOSIS — Z6825 Body mass index (BMI) 25.0-25.9, adult: Secondary | ICD-10-CM

## 2021-01-21 DIAGNOSIS — Z23 Encounter for immunization: Secondary | ICD-10-CM

## 2021-01-21 LAB — COMPREHENSIVE METABOLIC PANEL WITH GFR
ALT: 35 U/L (ref 0–53)
AST: 28 U/L (ref 0–37)
Albumin: 4.8 g/dL (ref 3.5–5.2)
Alkaline Phosphatase: 45 U/L (ref 39–117)
BUN: 21 mg/dL (ref 6–23)
CO2: 28 meq/L (ref 19–32)
Calcium: 9.7 mg/dL (ref 8.4–10.5)
Chloride: 100 meq/L (ref 96–112)
Creatinine, Ser: 1.06 mg/dL (ref 0.40–1.50)
GFR: 90.53 mL/min
Glucose, Bld: 78 mg/dL (ref 70–99)
Potassium: 3.8 meq/L (ref 3.5–5.1)
Sodium: 139 meq/L (ref 135–145)
Total Bilirubin: 0.6 mg/dL (ref 0.2–1.2)
Total Protein: 7.7 g/dL (ref 6.0–8.3)

## 2021-01-21 LAB — LIPID PANEL
Cholesterol: 293 mg/dL — ABNORMAL HIGH (ref 0–200)
HDL: 59.7 mg/dL
LDL Cholesterol: 202 mg/dL — ABNORMAL HIGH (ref 0–99)
NonHDL: 233.7
Total CHOL/HDL Ratio: 5
Triglycerides: 158 mg/dL — ABNORMAL HIGH (ref 0.0–149.0)
VLDL: 31.6 mg/dL (ref 0.0–40.0)

## 2021-01-21 LAB — CBC
HCT: 45.7 % (ref 39.0–52.0)
Hemoglobin: 15.3 g/dL (ref 13.0–17.0)
MCHC: 33.6 g/dL (ref 30.0–36.0)
MCV: 89.6 fl (ref 78.0–100.0)
Platelets: 210 K/uL (ref 150.0–400.0)
RBC: 5.1 Mil/uL (ref 4.22–5.81)
RDW: 13.7 % (ref 11.5–15.5)
WBC: 6.7 K/uL (ref 4.0–10.5)

## 2021-01-21 LAB — URIC ACID: Uric Acid, Serum: 5.2 mg/dL (ref 4.0–7.8)

## 2021-01-21 LAB — TSH: TSH: 1.31 u[IU]/mL (ref 0.35–5.50)

## 2021-01-21 LAB — HEMOGLOBIN A1C: Hgb A1c MFr Bld: 5.5 % (ref 4.6–6.5)

## 2021-01-21 NOTE — Progress Notes (Signed)
Chief Complaint:  Daniel Gilbert is a 35 y.o. male who presents today for his annual comprehensive physical exam.    Assessment/Plan:  New/Acute Problems: Skin Lump Likely calcification from recent mild olecranon bursitis or tendinitis.  We will continue with watchful waiting.  Reassuring that symptoms have not changed over the last several months to years.  Chronic Problems Addressed Today: Hyperglycemia Check A1c.   Gout Still has intermittent pain in his right great toe.  We will check uric acid level today.  Adjust dose of allopurinol as needed.  Will let me know if he wants to be referred back to sports medicine or PT.  Dyslipidemia Could not tolerate Lipitor.  We will check labs today.  If he continues to have elevated cholesterol will likely start simvastatin.   Body mass index is 25.52 kg/m. / Overweight  BMI Metric Follow Up - 01/21/21 1323       BMI Metric Follow Up-Please document annually   BMI Metric Follow Up Education provided              Preventative Healthcare: Will get blood work done today. Will get flu vaccine today.  Patient Counseling(The following topics were reviewed and/or handout was given):  -Nutrition: Stressed importance of moderation in sodium/caffeine intake, saturated fat and cholesterol, caloric balance, sufficient intake of fresh fruits, vegetables, and fiber.  -Stressed the importance of regular exercise.   -Substance Abuse: Discussed cessation/primary prevention of tobacco, alcohol, or other drug use; driving or other dangerous activities under the influence; availability of treatment for abuse.   -Injury prevention: Discussed safety belts, safety helmets, smoke detector, smoking near bedding or upholstery.   -Sexuality: Discussed sexually transmitted diseases, partner selection, use of condoms, avoidance of unintended pregnancy and contraceptive alternatives.   -Dental health: Discussed importance of regular tooth brushing,  flossing, and dental visits.  -Health maintenance and immunizations reviewed. Please refer to Health maintenance section.  Return to care in 1 year for next preventative visit.     Subjective:  HPI:  He has no acute complaints today.   He is here with bump in his arm. He would like this to be checked today.   He still have issue with his right great toe pain. He has a PMhx of gout. He follows up with his sport medicine doctor for this issue. He has had one episode of gout few months ago. He is taking allopurinol 300 mg daily and tolerating well.   Lifestyle Diet: Trying to cut sugar and processed food. Exercise: None specific  Depression screen PHQ 2/9 01/21/2021  Decreased Interest 0  Down, Depressed, Hopeless 0  PHQ - 2 Score 0    Health Maintenance Due  Topic Date Due   Hepatitis C Screening  Never done   INFLUENZA VACCINE  11/09/2020     ROS: Per HPI, otherwise a complete review of systems was negative.   PMH:  The following were reviewed and entered/updated in epic: Past Medical History:  Diagnosis Date   Hyperlipidemia    Patient Active Problem List   Diagnosis Date Noted   GERD (gastroesophageal reflux disease) 11/25/2019   Hyperglycemia 11/25/2019   Gout 05/29/2017   Dyslipidemia 03/07/2014   Past Surgical History:  Procedure Laterality Date   CLAVICLE SURGERY Right    ROTATOR CUFF REPAIR Right     Family History  Problem Relation Age of Onset   Depression Mother    Heart attack Maternal Grandfather    Hyperlipidemia Maternal Grandfather  Stroke Maternal Grandfather    Alcohol abuse Paternal Grandfather    Cancer Paternal Grandfather     Medications- reviewed and updated Current Outpatient Medications  Medication Sig Dispense Refill   albuterol (VENTOLIN HFA) 108 (90 Base) MCG/ACT inhaler TAKE 2 PUFFS BY MOUTH EVERY 6 HOURS AS NEEDED FOR WHEEZE OR SHORTNESS OF BREATH 8.5 each 0   allopurinol (ZYLOPRIM) 300 MG tablet Take 1 tablet (300 mg  total) by mouth daily. 30 tablet 1   No current facility-administered medications for this visit.    Allergies-reviewed and updated No Known Allergies  Social History   Socioeconomic History   Marital status: Married    Spouse name: Not on file   Number of children: 1   Years of education: Not on file   Highest education level: Not on file  Occupational History   Occupation: Production designer, theatre/television/film  Tobacco Use   Smoking status: Never   Smokeless tobacco: Never  Vaping Use   Vaping Use: Never used  Substance and Sexual Activity   Alcohol use: Yes   Drug use: No   Sexual activity: Yes  Other Topics Concern   Not on file  Social History Narrative   Not on file   Social Determinants of Health   Financial Resource Strain: Not on file  Food Insecurity: Not on file  Transportation Needs: Not on file  Physical Activity: Not on file  Stress: Not on file  Social Connections: Not on file        Objective:  Physical Exam: BP (!) 121/58   Pulse 68   Temp 98.2 F (36.8 C) (Temporal)   Ht 5\' 11"  (1.803 m)   Wt 183 lb (83 kg)   SpO2 98%   BMI 25.52 kg/m   Body mass index is 25.52 kg/m. Wt Readings from Last 3 Encounters:  01/21/21 183 lb (83 kg)  03/16/20 189 lb (85.7 kg)  12/06/19 186 lb 3.2 oz (84.5 kg)   Gen: NAD, resting comfortably HEENT: TMs normal bilaterally. OP clear. No thyromegaly noted.  CV: RRR with no murmurs appreciated Pulm: NWOB, CTAB with no crackles, wheezes, or rhonchi GI: Normal bowel sounds present. Soft, Nontender, Nondistended. MSK: no edema, cyanosis, or clubbing noted Skin: warm, dry. Freely mobile 1cm mass on left dorsal elbow near olecranon process Neuro: CN2-12 grossly intact. Strength 5/5 in upper and lower extremities. Reflexes symmetric and intact bilaterally.  Psych: Normal affect and thought content      I,Savera Zaman,acting as a scribe for 12/08/19, MD.,have documented all relevant documentation on the behalf of Jacquiline Doe, MD,as  directed by  Jacquiline Doe, MD while in the presence of Jacquiline Doe, MD.   I, Jacquiline Doe, MD, have reviewed all documentation for this visit. The documentation on 01/21/21 for the exam, diagnosis, procedures, and orders are all accurate and complete.  01/23/21. Katina Degree, MD 01/21/2021 1:24 PM

## 2021-01-21 NOTE — Assessment & Plan Note (Signed)
Still has intermittent pain in his right great toe.  We will check uric acid level today.  Adjust dose of allopurinol as needed.  Will let me know if he wants to be referred back to sports medicine or PT.

## 2021-01-21 NOTE — Assessment & Plan Note (Signed)
Could not tolerate Lipitor.  We will check labs today.  If he continues to have elevated cholesterol will likely start simvastatin.

## 2021-01-21 NOTE — Assessment & Plan Note (Signed)
Check A1c. 

## 2021-01-21 NOTE — Patient Instructions (Signed)
It was very nice to see you today!  We will check blood work.  You have completed your form today.  Please continue working on diet and exercise.  I will see back in a year.  Please come back to see me sooner if needed.  Take care, Dr Jimmey Ralph  PLEASE NOTE:  If you had any lab tests please let us know if you have not heard back within a few days. You may see your results on mychart before we have a chance to review them but we will give you a call once they are reviewed by [place]. If we ordered any referrals today, please let us know if you have not heard from their office within the next week.   Please try these tips to maintain a healthy lifestyle:  Eat at least 3 REAL meals and 1-2 snacks per day.  Aim for no more than 5 hours between eating.  If you eat breakfast, please do so within one hour of getting up.   Each meal should contain half fruits/vegetables, one quarter protein, and one quarter carbs (no bigger than a computer mouse)  Cut down on sweet beverages. This includes juice, soda, and sweet tea.   Drink at least 1 glass of water with each meal and aim for at least 8 glasses per day  Exercise at least 150 minutes every week.    Preventive Care 57-87 Years Old, Male Preventive care refers to lifestyle choices and visits with your health care provider that can promote health and wellness. This includes: A yearly physical exam. This is also called an annual wellness visit. Regular dental and eye exams. Immunizations. Screening for certain conditions. Healthy lifestyle choices, such as: Eating a healthy diet. Getting regular exercise. Not using drugs or products that contain nicotine and tobacco. Limiting alcohol use. What can I expect for my preventive care visit? Physical exam Your health care provider may check your: Height and weight. These may be used to calculate your BMI (body mass index). BMI is a measurement that tells if you are at a healthy weight. Heart rate  and blood pressure. Body temperature. Skin for abnormal spots. Counseling Your health care provider may ask you questions about your: Past medical problems. Family's medical history. Alcohol, tobacco, and drug use. Emotional well-being. Home life and relationship well-being. Sexual activity. Diet, exercise, and sleep habits. Work and work Astronomer. Access to firearms. What immunizations do I need? Vaccines are usually given at various ages, according to a schedule. Your health care provider will recommend vaccines for you based on your age, medical history, and lifestyle or other factors, such as travel or where you work. What tests do I need? Blood tests Lipid and cholesterol levels. These may be checked every 5 years starting at age 37. Hepatitis C test. Hepatitis B test. Screening  Diabetes screening. This is done by checking your blood sugar (glucose) after you have not eaten for a while (fasting). Genital exam to check for testicular cancer or hernias. STD (sexually transmitted disease) testing, if you are at risk. Talk with your health care provider about your test results, treatment options, and if necessary, the need for more tests. Follow these instructions at home: Eating and drinking  Eat a healthy diet that includes fresh fruits and vegetables, whole grains, lean protein, and low-fat dairy products. Drink enough fluid to keep your urine pale yellow. Take vitamin and mineral supplements as recommended by your health care provider. Do not drink alcohol if your health  care provider tells you not to drink. If you drink alcohol: Limit how much you have to 0-2 drinks a day. Be aware of how much alcohol is in your drink. In the [place] one drink equals one 12 oz bottle of beer (355 mL), one 5 oz glass of wine (148 mL), or one 1 oz glass of hard liquor (44 mL). Lifestyle Take daily care of your teeth and gums. Brush your teeth every morning and night with fluoride  toothpaste. Floss one time each day. Stay active. Exercise for at least 30 minutes 5 or more days each week. Do not use any products that contain nicotine or tobacco, such as cigarettes, e-cigarettes, and chewing tobacco. If you need help quitting, ask your health care provider. Do not use drugs. If you are sexually active, practice safe sex. Use a condom or other form of protection to prevent STIs (sexually transmitted infections). Find healthy ways to cope with stress, such as: Meditation, yoga, or listening to music. Journaling. Talking to a trusted person. Spending time with friends and family. Safety Always wear your seat belt while driving or riding in a vehicle. Do not drive: If you have been drinking alcohol. Do not ride with someone who has been drinking. When you are tired or distracted. While texting. Wear a helmet and other protective equipment during sports activities. If you have firearms in your house, make sure you follow all gun safety procedures. Seek help if you have been physically or sexually abused. What's next? Go to your health care provider once a year for an annual wellness visit. Ask your health care provider how often you should have your eyes and teeth checked. Stay up to date on all vaccines. This information is not intended to replace advice given to you by your health care provider. Make sure you discuss any questions you have with your health care provider. Document Revised: 06/05/2020 Document Reviewed: 03/22/2018 Elsevier Patient Education  2022 [employer].

## 2021-01-22 NOTE — Progress Notes (Signed)
Please inform patient of the following:  His cholesterol is very high. Recommend starting simvastatin 40mg  daily. This is less intense than the lipitor he was on previously and he should not have side effects with this. All of his other labs are stable. His uric acid levels for gout are at goal.   Do not need to make any other changes to his treatment plan at this time. We should recheck cholesterol in 3-6 months.  . Katina Degree, MD 01/22/2021 4:04 PM

## 2021-01-24 ENCOUNTER — Encounter: Payer: Self-pay | Admitting: Family Medicine

## 2021-01-25 ENCOUNTER — Other Ambulatory Visit: Payer: Self-pay

## 2021-01-25 MED ORDER — SIMVASTATIN 40 MG PO TABS: 40.0000 mg | ORAL_TABLET | Freq: Every day | ORAL | 3 refills | Status: DC

## 2021-02-03 ENCOUNTER — Encounter: Payer: Self-pay | Admitting: Family Medicine

## 2021-02-05 ENCOUNTER — Other Ambulatory Visit: Payer: Self-pay | Admitting: *Deleted

## 2021-02-05 MED ORDER — ALLOPURINOL 300 MG PO TABS: 300.0000 mg | ORAL_TABLET | Freq: Every day | ORAL | 1 refills | Status: DC

## 2021-02-26 ENCOUNTER — Encounter: Payer: Self-pay | Admitting: Family Medicine

## 2021-02-26 ENCOUNTER — Other Ambulatory Visit: Payer: Self-pay

## 2021-02-26 ENCOUNTER — Ambulatory Visit: Payer: 59 | Admitting: Family Medicine

## 2021-02-26 VITALS — BP 143/80 | HR 59 | Temp 98.2°F | Ht 71.0 in | Wt 186.2 lb

## 2021-02-26 DIAGNOSIS — E785 Hyperlipidemia, unspecified: Secondary | ICD-10-CM

## 2021-02-26 DIAGNOSIS — M109 Gout, unspecified: Secondary | ICD-10-CM | POA: Diagnosis not present

## 2021-02-26 MED ORDER — ROSUVASTATIN CALCIUM 5 MG PO TABS: 5.0000 mg | ORAL_TABLET | Freq: Every day | ORAL | 3 refills | Status: DC

## 2021-02-26 NOTE — Patient Instructions (Signed)
It was very nice to see you today!  We will change your cholesterol medication to Crestor.  Please stop your simvastatin for a week before starting Crestor.  Send a message in a few weeks if you have any side effects.  We can recheck your cholesterol level in about 3 months.  Take care, Dr Jimmey Ralph  PLEASE NOTE:  If you had any lab tests please let us know if you have not heard back within a few days. You may see your results on mychart before we have a chance to review them but we will give you a call once they are reviewed by [place]. If we ordered any referrals today, please let us know if you have not heard from their office within the next week.   Please try these tips to maintain a healthy lifestyle:  Eat at least 3 REAL meals and 1-2 snacks per day.  Aim for no more than 5 hours between eating.  If you eat breakfast, please do so within one hour of getting up.   Each meal should contain half fruits/vegetables, one quarter protein, and one quarter carbs (no bigger than a computer mouse)  Cut down on sweet beverages. This includes juice, soda, and sweet tea.   Drink at least 1 glass of water with each meal and aim for at least 8 glasses per day  Exercise at least 150 minutes every week.

## 2021-02-26 NOTE — Assessment & Plan Note (Addendum)
Doing well on allopurinol 300 mg daily.

## 2021-02-26 NOTE — Assessment & Plan Note (Signed)
Did not tolerate simvastatin due to myalgias.  He is interested in trying low-dose Crestor.  We will start 5 mg daily.  We can recheck lipids in 3 months.  He will send me a message in a few weeks to let me know if he is having any side effects to Crestor.  If so we can try every other day or twice weekly dosing.  May need referral to lipid clinic if he is not able to tolerate statins.

## 2021-02-26 NOTE — Progress Notes (Signed)
We will  Daniel Gilbert is a 35 y.o. male who presents today for an office visit.  Assessment/Plan:  Chronic Problems Addressed Today: Gout Doing well on allopurinol 300 mg daily.  Dyslipidemia Did not tolerate simvastatin due to myalgias.  He is interested in trying low-dose Crestor.  We will start 5 mg daily.  We can recheck lipids in 3 months.  He will send me a message in a few weeks to let me know if he is having any side effects to Crestor.  If so we can try every other day or twice weekly dosing.  May need referral to lipid clinic if he is not able to tolerate statins.     Subjective:  HPI:  He states that the cholesterol medication is causing the knee pain which is debilitating in nature. However, due to his extensive FMHx and PMHx of cholesterol and heart complications, he requires cholesterol medication in the longterm. Because of this, he is wondering if there could be an analagous medication that could be used to avoid side effects.  He has made an effort to change his diet to reduce his cholesterol.       Objective:  Physical Exam: BP (!) 143/80 (BP Location: Right Arm)   Pulse (!) 59   Temp 98.2 F (36.8 C) (Temporal)   Ht 5\' 11"  (1.803 m)   Wt 186 lb 3.2 oz (84.5 kg)   SpO2 99%   BMI 25.97 kg/m   Gen: No acute distress, resting comfortably CV: Regular rate and rhythm with no murmurs appreciated Pulm: Normal work of breathing, clear to auscultation bilaterally with no crackles, wheezes, or rhonchi Neuro: Grossly normal, moves all extremities Psych: Normal affect and thought content      I,Jordan Kelly,acting as a scribe for , MD.,have documented all relevant documentation on the behalf of Jacquiline Doe, MD,as directed by  Jacquiline Doe, MD while in the presence of Jacquiline Doe, MD.  I, Jacquiline Doe, MD, have reviewed all documentation for this visit. The documentation on 02/26/21 for the exam, diagnosis, procedures, and orders are all accurate  and complete.  02/28/21. Katina Degree, MD 02/26/2021 1:42 PM

## 2021-05-31 ENCOUNTER — Other Ambulatory Visit (INDEPENDENT_AMBULATORY_CARE_PROVIDER_SITE_OTHER): Payer: 59

## 2021-05-31 ENCOUNTER — Other Ambulatory Visit: Payer: Self-pay | Admitting: *Deleted

## 2021-05-31 ENCOUNTER — Other Ambulatory Visit: Payer: 59

## 2021-05-31 ENCOUNTER — Other Ambulatory Visit: Payer: Self-pay

## 2021-05-31 DIAGNOSIS — E785 Hyperlipidemia, unspecified: Secondary | ICD-10-CM | POA: Diagnosis not present

## 2021-06-01 ENCOUNTER — Encounter: Payer: Self-pay | Admitting: Family Medicine

## 2021-06-01 LAB — LIPID PANEL
Cholesterol: 184 mg/dL (ref 0–200)
HDL: 61 mg/dL (ref >39.00)
NonHDL: 123.3
Total CHOL/HDL Ratio: 3
Triglycerides: 255 mg/dL — ABNORMAL HIGH (ref 0.0–149.0)
VLDL: 51 mg/dL — ABNORMAL HIGH (ref 0.0–40.0)

## 2021-06-01 LAB — LDL CHOLESTEROL, DIRECT: Direct LDL: 91 mg/dL

## 2021-06-01 NOTE — Progress Notes (Signed)
Please inform patient of the following:  Total cholesterol much better than last time.  His LDL is at goal as well.  His triglycerides are up a little bit however they are still at a reasonable level.  He can continue taking Crestor and we can recheck at his next physical.

## 2021-06-01 NOTE — Telephone Encounter (Signed)
Please advise 

## 2021-06-07 ENCOUNTER — Other Ambulatory Visit: Payer: Self-pay | Admitting: Family Medicine

## 2021-09-07 ENCOUNTER — Other Ambulatory Visit: Payer: Self-pay | Admitting: Family Medicine

## 2022-01-03 ENCOUNTER — Encounter: Payer: Self-pay | Admitting: *Deleted

## 2022-01-05 ENCOUNTER — Ambulatory Visit: Payer: 59 | Admitting: Family Medicine

## 2022-01-05 ENCOUNTER — Encounter: Payer: Self-pay | Admitting: Family Medicine

## 2022-01-05 VITALS — BP 130/86 | HR 78 | Temp 97.7°F | Ht 71.0 in | Wt 187.4 lb

## 2022-01-05 DIAGNOSIS — K644 Residual hemorrhoidal skin tags: Secondary | ICD-10-CM

## 2022-01-05 DIAGNOSIS — Z23 Encounter for immunization: Secondary | ICD-10-CM | POA: Diagnosis not present

## 2022-01-05 DIAGNOSIS — E785 Hyperlipidemia, unspecified: Secondary | ICD-10-CM | POA: Diagnosis not present

## 2022-01-05 DIAGNOSIS — R229 Localized swelling, mass and lump, unspecified: Secondary | ICD-10-CM | POA: Diagnosis not present

## 2022-01-05 DIAGNOSIS — R739 Hyperglycemia, unspecified: Secondary | ICD-10-CM

## 2022-01-05 DIAGNOSIS — M109 Gout, unspecified: Secondary | ICD-10-CM | POA: Diagnosis not present

## 2022-01-05 NOTE — Assessment & Plan Note (Signed)
He will come back in a few weeks we can recheck A1c.

## 2022-01-05 NOTE — Progress Notes (Signed)
   Daniel Gilbert is a 36 y.o. male who presents today for an office visit.  Assessment/Plan:  New/Acute Problems: Anal Lump Possibly had prolapsed internal hemorrhoid.  Reassuring exam today without any obvious hemorrhoids or other masses.  It is reassuring that symptoms have resolved.  Will continue with watchful waiting.  We discussed reasons return to care.  Perianal Skin Tag Likely secondary to previous hemorrhoids or anal fissure.  This is not causing him any significant issues currently.  We will continue to watch waiting.  Discussed reasons to return to care.  Elbow Lump Consistent with possible lipoma.  No red flag signs or symptoms however due to anatomic location he would like to have it removed.  We will place referral to surgery.  Chronic Problems Addressed Today: Hyperglycemia He will come back in a few weeks we can recheck A1c.  Gout Doing well on allopurinol 300 mg daily.  No recent flares.  He will come back in a few weeks for CPE and we can check uric acid level at that point.  Dyslipidemia On Crestor 5 mg daily.  He will come back in a few weeks and we can recheck lipids at that time.  Flu shot given today.    Subjective:  HPI:  Patient here with a few things he would like to discuss today.  He has noticed a lump on his left elbow. This has gotten a little bit bigger over the last year or so but has been present for over a year.  No Gilbert at rest though he does notice he gets irritated and sore due to the location near his elbow whenever he rests his arms on armrest or desk.  A couple of weeks ago he noticed a bump while wiping after having a bowel movement.  This has decreased significantly since then and he no longer notices the lump. He has not noticed any rectal bleeding.   He has also noticed a small skin bump in the area as well. This has been going on for several months. This is not painful but he has noticed that it sometimes becomes irritated.         Objective:  Physical Exam: BP 130/86   Pulse 78   Temp 97.7 F (36.5 C) (Temporal)   Ht 5\' 11"  (1.803 m)   Wt 187 lb 6.4 oz (85 kg)   SpO2 99%   BMI 26.14 kg/m   Gen: No acute distress, resting comfortably CV: Regular rate and rhythm with no murmurs appreciated Pulm: Normal work of breathing, clear to auscultation bilaterally with no crackles, wheezes, or rhonchi MSK: Freely mobile approximately 1 cm subcutaneous mass on left elbow approximately 4 to 5 cm distal to olecranon process GU: Small perianal skin tag noted.  No hemorrhoid or other masses appreciated. Neuro: Grossly normal, moves all extremities Psych: Normal affect and thought content      Daniel Gilbert M. Daniel Pain, MD 01/05/2022 9:28 AM

## 2022-01-05 NOTE — Assessment & Plan Note (Signed)
Doing well on allopurinol 300 mg daily.  No recent flares.  He will come back in a few weeks for CPE and we can check uric acid level at that point.

## 2022-01-05 NOTE — Assessment & Plan Note (Signed)
On Crestor 5 mg daily.  He will come back in a few weeks and we can recheck lipids at that time.

## 2022-01-05 NOTE — Patient Instructions (Signed)
It was very nice to see you today!  I will refer you to see a surgeon to take the lump off of your arm.  You have a small skin tag.  This is probably due to a hemorrhoid that she had several years ago.  We can continue to monitor for now.  Please let us know if you have any worsening pain, new lumps, or bleeding.  We will see you back in a few weeks for your annual physical.  Come back sooner if needed.  Take care, Dr [name]  PLEASE NOTE:  If you had any lab tests please let us know if you have not heard back within a few days. You may see your results on mychart before we have a chance to review them but we will give you a call once they are reviewed by [place]. If we ordered any referrals today, please let us know if you have not heard from their office within the next week.   Please try these tips to maintain a healthy lifestyle:  Eat at least 3 REAL meals and 1-2 snacks per day.  Aim for no more than 5 hours between eating.  If you eat breakfast, please do so within one hour of getting up.   Each meal should contain half fruits/vegetables, one quarter protein, and one quarter carbs (no bigger than a computer mouse)  Cut down on sweet beverages. This includes juice, soda, and sweet tea.   Drink at least 1 glass of water with each meal and aim for at least 8 glasses per day  Exercise at least 150 minutes every week.

## 2022-01-07 ENCOUNTER — Other Ambulatory Visit: Payer: Self-pay | Admitting: Family Medicine

## 2022-01-24 ENCOUNTER — Ambulatory Visit (INDEPENDENT_AMBULATORY_CARE_PROVIDER_SITE_OTHER): Payer: 59 | Admitting: Family Medicine

## 2022-01-24 ENCOUNTER — Encounter: Payer: Self-pay | Admitting: Family Medicine

## 2022-01-24 VITALS — BP 115/80 | HR 68 | Temp 97.7°F | Ht 71.0 in | Wt 186.0 lb

## 2022-01-24 DIAGNOSIS — E785 Hyperlipidemia, unspecified: Secondary | ICD-10-CM

## 2022-01-24 DIAGNOSIS — M2550 Pain in unspecified joint: Secondary | ICD-10-CM

## 2022-01-24 DIAGNOSIS — R739 Hyperglycemia, unspecified: Secondary | ICD-10-CM | POA: Diagnosis not present

## 2022-01-24 DIAGNOSIS — Z0001 Encounter for general adult medical examination with abnormal findings: Secondary | ICD-10-CM | POA: Diagnosis not present

## 2022-01-24 DIAGNOSIS — M109 Gout, unspecified: Secondary | ICD-10-CM | POA: Diagnosis not present

## 2022-01-24 LAB — LIPID PANEL
Cholesterol: 183 mg/dL (ref 0–200)
HDL: 57.5 mg/dL (ref >39.00)
LDL Cholesterol: 106 mg/dL — ABNORMAL HIGH (ref 0–99)
NonHDL: 125.25
Total CHOL/HDL Ratio: 3
Triglycerides: 94 mg/dL (ref 0.0–149.0)
VLDL: 18.8 mg/dL (ref 0.0–40.0)

## 2022-01-24 LAB — CBC
HCT: 44.4 % (ref 39.0–52.0)
Hemoglobin: 15.1 g/dL (ref 13.0–17.0)
MCHC: 34 g/dL (ref 30.0–36.0)
MCV: 89.1 fl (ref 78.0–100.0)
Platelets: 206 10*3/uL (ref 150.0–400.0)
RBC: 4.98 Mil/uL (ref 4.22–5.81)
RDW: 14.1 % (ref 11.5–15.5)
WBC: 4.7 10*3/uL (ref 4.0–10.5)

## 2022-01-24 LAB — COMPREHENSIVE METABOLIC PANEL
ALT: 26 U/L (ref 0–53)
AST: 26 U/L (ref 0–37)
Albumin: 4.9 g/dL (ref 3.5–5.2)
Alkaline Phosphatase: 49 U/L (ref 39–117)
BUN: 19 mg/dL (ref 6–23)
CO2: 29 mEq/L (ref 19–32)
Calcium: 9.5 mg/dL (ref 8.4–10.5)
Chloride: 102 mEq/L (ref 96–112)
Creatinine, Ser: 1.12 mg/dL (ref 0.40–1.50)
GFR: 84.14 mL/min (ref >60.00)
Glucose, Bld: 111 mg/dL — ABNORMAL HIGH (ref 70–99)
Potassium: 4.8 mEq/L (ref 3.5–5.1)
Sodium: 137 mEq/L (ref 135–145)
Total Bilirubin: 0.5 mg/dL (ref 0.2–1.2)
Total Protein: 7.6 g/dL (ref 6.0–8.3)

## 2022-01-24 LAB — URIC ACID: Uric Acid, Serum: 4.7 mg/dL (ref 4.0–7.8)

## 2022-01-24 LAB — HEMOGLOBIN A1C: Hgb A1c MFr Bld: 5.6 % (ref 4.6–6.5)

## 2022-01-24 LAB — TSH: TSH: 1.51 u[IU]/mL (ref 0.35–5.50)

## 2022-01-24 NOTE — Assessment & Plan Note (Signed)
No red flag.  Likely has some mild degenerative changes.  He can continue using over-the-counter meds as needed.  We discussed referral to orthopedics or sports medicine however he declined.

## 2022-01-24 NOTE — Progress Notes (Signed)
Chief Complaint:  Daniel Gilbert is a 36 y.o. male who presents today for his annual comprehensive physical exam.    Assessment/Plan:  Chronic Problems Addressed Today: Arthralgia No red flag.  Likely has some mild degenerative changes.  He can continue using over-the-counter meds as needed.  We discussed referral to orthopedics or sports medicine however he declined.  Hyperglycemia Check A1c.  Gout No recent flares.  Continue allopurinol 300 mg daily.  Check uric acid level.  Dyslipidemia Check lipids.  Continue Crestor 5 mg daily.  Preventative Healthcare: Check labs. UTD on vaccines.   Patient Counseling(The following topics were reviewed and/or handout was given):  -Nutrition: Stressed importance of moderation in sodium/caffeine intake, saturated fat and cholesterol, caloric balance, sufficient intake of fresh fruits, vegetables, and fiber.  -Stressed the importance of regular exercise.   -Substance Abuse: Discussed cessation/primary prevention of tobacco, alcohol, or other drug use; driving or other dangerous activities under the influence; availability of treatment for abuse.   -Injury prevention: Discussed safety belts, safety helmets, smoke detector, smoking near bedding or upholstery.   -Sexuality: Discussed sexually transmitted diseases, partner selection, use of condoms, avoidance of unintended pregnancy and contraceptive alternatives.   -Dental health: Discussed importance of regular tooth brushing, flossing, and dental visits.  -Health maintenance and immunizations reviewed. Please refer to Health maintenance section.  Return to care in 1 year for next preventative visit.     Subjective:  HPI:  He has no acute complaints today.   Lifestyle Diet: None specific. Trying to get plenty of fruits and vegetables.  Exercise: Tries to get exercise every day.      01/24/2022    8:32 AM  Depression screen PHQ 2/9  Decreased Interest 0  Down, Depressed,  Hopeless 0  PHQ - 2 Score 0    There are no preventive care reminders to display for this patient.    ROS: Per HPI, otherwise a complete review of systems was negative.   PMH:  The following were reviewed and entered/updated in epic: Past Medical History:  Diagnosis Date   Hyperlipidemia    Patient Active Problem List   Diagnosis Date Noted   Arthralgia 01/24/2022   GERD (gastroesophageal reflux disease) 11/25/2019   Hyperglycemia 11/25/2019   Gout 05/29/2017   Dyslipidemia 03/07/2014   Past Surgical History:  Procedure Laterality Date   CLAVICLE SURGERY Right    ROTATOR CUFF REPAIR Right     Family History  Problem Relation Age of Onset   Depression Mother    Heart attack Maternal Grandfather    Hyperlipidemia Maternal Grandfather    Stroke Maternal Grandfather    Alcohol abuse Paternal Grandfather    Cancer Paternal Grandfather     Medications- reviewed and updated Current Outpatient Medications  Medication Sig Dispense Refill   allopurinol (ZYLOPRIM) 300 MG tablet TAKE 1 TABLET BY MOUTH EVERY DAY 90 tablet 1   rosuvastatin (CRESTOR) 5 MG tablet TAKE 1 TABLET (5 MG TOTAL) BY MOUTH DAILY. 90 tablet 3   No current facility-administered medications for this visit.    Allergies-reviewed and updated No Known Allergies  Social History   Socioeconomic History   Marital status: Married    Spouse name: Not on file   Number of children: 1   Years of education: Not on file   Highest education level: Not on file  Occupational History   Occupation: Freight forwarder  Tobacco Use   Smoking status: Never   Smokeless tobacco: Never  Vaping Use   Vaping  Use: Never used  Substance and Sexual Activity   Alcohol use: Yes   Drug use: No   Sexual activity: Yes  Other Topics Concern   Not on file  Social History Narrative   Not on file   Social Determinants of Health   Financial Resource Strain: Not on file  Food Insecurity: Not on file  Transportation Needs: Not on  file  Physical Activity: Not on file  Stress: Not on file  Social Connections: Not on file        Objective:  Physical Exam: BP 115/80   Pulse 68   Temp 97.7 F (36.5 C) (Temporal)   Ht 5\' 11"  (1.803 m)   Wt 186 lb (84.4 kg)   SpO2 99%   BMI 25.94 kg/m   Body mass index is 25.94 kg/m. Wt Readings from Last 3 Encounters:  01/24/22 186 lb (84.4 kg)  01/05/22 187 lb 6.4 oz (85 kg)  02/26/21 186 lb 3.2 oz (84.5 kg)   Gen: NAD, resting comfortably HEENT: TMs normal bilaterally. OP clear. No thyromegaly noted.  CV: RRR with no murmurs appreciated Pulm: NWOB, CTAB with no crackles, wheezes, or rhonchi GI: Normal bowel sounds present. Soft, Nontender, Nondistended. MSK: no edema, cyanosis, or clubbing noted Skin: warm, dry Neuro: CN2-12 grossly intact. Strength 5/5 in upper and lower extremities. Reflexes symmetric and intact bilaterally.  Psych: Normal affect and thought content     Mort Smelser M. 02/28/21, MD 01/24/2022 8:47 AM

## 2022-01-24 NOTE — Assessment & Plan Note (Signed)
No recent flares.  Continue allopurinol 300 mg daily.  Check uric acid level.

## 2022-01-24 NOTE — Assessment & Plan Note (Signed)
Check lipids.  Continue Crestor 5 mg daily.

## 2022-01-24 NOTE — Patient Instructions (Signed)
It was very nice to see you today!  We will check blood work today.  Please continue to work on diet and exercise.  Please call to schedule an appointment with your surgeon soon.  I will see back in year for your next physical.  Come back sooner if needed.  Take care, Dr [name]  PLEASE NOTE:  If you had any lab tests please let us know if you have not heard back within a few days. You may see your results on mychart before we have a chance to review them but we will give you a call once they are reviewed by [place]. If we ordered any referrals today, please let us know if you have not heard from their office within the next week.   Please try these tips to maintain a healthy lifestyle:  Eat at least 3 REAL meals and 1-2 snacks per day.  Aim for no more than 5 hours between eating.  If you eat breakfast, please do so within one hour of getting up.   Each meal should contain half fruits/vegetables, one quarter protein, and one quarter carbs (no bigger than a computer mouse)  Cut down on sweet beverages. This includes juice, soda, and sweet tea.   Drink at least 1 glass of water with each meal and aim for at least 8 glasses per day  Exercise at least 150 minutes every week.    Preventive Care 37-19 Years Old, Male Preventive care refers to lifestyle choices and visits with your health care provider that can promote health and wellness. Preventive care visits are also called wellness exams. What can I expect for my preventive care visit? Counseling During your preventive care visit, your health care provider may ask about your: Medical history, including: Past medical problems. Family medical history. Current health, including: Emotional well-being. Home life and relationship well-being. Sexual activity. Lifestyle, including: Alcohol, nicotine or tobacco, and drug use. Access to firearms. Diet, exercise, and sleep habits. Safety issues such as seatbelt and bike helmet  use. Sunscreen use. Work and work Statistician. Physical exam Your health care provider may check your: Height and weight. These may be used to calculate your BMI (body mass index). BMI is a measurement that tells if you are at a healthy weight. Waist circumference. This measures the distance around your waistline. This measurement also tells if you are at a healthy weight and may help predict your risk of certain diseases, such as type 2 diabetes and high blood pressure. Heart rate and blood pressure. Body temperature. Skin for abnormal spots. What immunizations do I need?  Vaccines are usually given at various ages, according to a schedule. Your health care provider will recommend vaccines for you based on your age, medical history, and lifestyle or other factors, such as travel or where you work. What tests do I need? Screening Your health care provider may recommend screening tests for certain conditions. This may include: Lipid and cholesterol levels. Diabetes screening. This is done by checking your blood sugar (glucose) after you have not eaten for a while (fasting). Hepatitis B test. Hepatitis C test. HIV (human immunodeficiency virus) test. STI (sexually transmitted infection) testing, if you are at risk. Talk with your health care provider about your test results, treatment options, and if necessary, the need for more tests. Follow these instructions at home: Eating and drinking  Eat a healthy diet that includes fresh fruits and vegetables, whole grains, lean protein, and low-fat dairy products. Drink enough fluid to keep  your urine pale yellow. Take vitamin and mineral supplements as recommended by your health care provider. Do not drink alcohol if your health care provider tells you not to drink. If you drink alcohol: Limit how much you have to 0-2 drinks a day. Know how much alcohol is in your drink. In the [place] one drink equals one 12 oz bottle of beer (355 mL), one 5 oz  glass of wine (148 mL), or one 1 oz glass of hard liquor (44 mL). Lifestyle Brush your teeth every morning and night with fluoride toothpaste. Floss one time each day. Exercise for at least 30 minutes 5 or more days each week. Do not use any products that contain nicotine or tobacco. These products include cigarettes, chewing tobacco, and vaping devices, such as e-cigarettes. If you need help quitting, ask your health care provider. Do not use drugs. If you are sexually active, practice safe sex. Use a condom or other form of protection to prevent STIs. Find healthy ways to manage stress, such as: Meditation, yoga, or listening to music. Journaling. Talking to a trusted person. Spending time with friends and family. Minimize exposure to UV radiation to reduce your risk of skin cancer. Safety Always wear your seat belt while driving or riding in a vehicle. Do not drive: If you have been drinking alcohol. Do not ride with someone who has been drinking. If you have been using any mind-altering substances or drugs. While texting. When you are tired or distracted. Wear a helmet and other protective equipment during sports activities. If you have firearms in your house, make sure you follow all gun safety procedures. Seek help if you have been physically or sexually abused. What's next? Go to your health care provider once a year for an annual wellness visit. Ask your health care provider how often you should have your eyes and teeth checked. Stay up to date on all vaccines. This information is not intended to replace advice given to you by your health care provider. Make sure you discuss any questions you have with your health care provider. Document Revised: 09/23/2020 Document Reviewed: 09/23/2020 Elsevier Patient Education  2023 [employer].

## 2022-01-24 NOTE — Assessment & Plan Note (Signed)
Check A1c. 

## 2022-01-27 NOTE — Progress Notes (Signed)
Please inform patient of the following:  Great news!  Labs are all stable.  Do not need to make any changes to his treatment plan at this time.  He should continue to work on diet and exercise and we can recheck in a year.

## 2022-02-02 ENCOUNTER — Encounter: Payer: Self-pay | Admitting: Family Medicine

## 2022-02-03 NOTE — Telephone Encounter (Signed)
Did he actually see a surgeon there? 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 This is not the same as having an elbow surgery. Orthopedics does not take out lipomas generally.  Algis Greenhouse. [name], MD 02/03/2022 2:28 PM

## 2022-02-14 NOTE — Telephone Encounter (Signed)
Please advise 

## 2022-02-15 ENCOUNTER — Other Ambulatory Visit: Payer: Self-pay | Admitting: *Deleted

## 2022-02-15 DIAGNOSIS — M2550 Pain in unspecified joint: Secondary | ICD-10-CM

## 2022-02-15 NOTE — Telephone Encounter (Signed)
Referral placed.

## 2022-02-15 NOTE — Telephone Encounter (Signed)
Please place referral to orthopedics.  Algis Greenhouse. [name], MD 02/15/2022 7:23 AM

## 2022-02-16 ENCOUNTER — Ambulatory Visit (INDEPENDENT_AMBULATORY_CARE_PROVIDER_SITE_OTHER): Payer: 59

## 2022-02-16 ENCOUNTER — Ambulatory Visit: Payer: 59 | Admitting: Orthopaedic Surgery

## 2022-02-16 ENCOUNTER — Encounter: Payer: Self-pay | Admitting: Orthopaedic Surgery

## 2022-02-16 DIAGNOSIS — R2232 Localized swelling, mass and lump, left upper limb: Secondary | ICD-10-CM | POA: Insufficient documentation

## 2022-02-16 NOTE — Progress Notes (Signed)
Office Visit Note   Patient: Daniel Gilbert           Date of Birth: 06/26/1985           MRN: [id number] Visit Date: 02/16/2022              Requested by: Ardith Dark, MD [address] [place],  [place] [address] PCP: Ardith Dark, MD   Assessment & Plan: Visit Diagnoses:  1. Elbow mass, left   2. Mass of left forearm     Plan: 36 year old gentleman had onset of a nontender freely mobile mass on the extensor surface of the left ulna about a year and a half ago.  It has not grown in size nor is it become the least bit symptomatic.  His primary care physician suggested he have it evaluated.  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  It was freely mobile and nontender.  It was not red and located several inches distal to the olecranon.  X-rays were nondiagnostic.  Discussed several options including surgical excision with biopsy versus monitoring over a longer period of time.  It certainly appears to be benign and does not appear to be interfering with his activities.  He elected to give it more time as it is asymptomatic and will let me know if he wishes to proceed with biopsy  Follow-Up Instructions: Return if symptoms worsen or fail to improve.   Orders:  Orders Placed This Encounter  Procedures   XR Elbow 2 Views Left   No orders of the defined types were placed in this encounter.     Procedures: No procedures performed   Clinical Data: No additional findings.   Subjective: Chief Complaint  Patient presents with   Left Elbow - Mass  Patient present today for his left elbow. He said that he noticed a knot on the backside of his arm, just distal to the elbow over a year ago. He said that it has grown in size. The area is not painful at all. He saw his PCP and was advised to follow up here to have it evaluated. No known injury.   HPI  Review of Systems   Objective: Vital Signs: There were no vitals taken for this  visit.  Physical Exam Constitutional:      Appearance: He is well-developed.  Eyes:     Pupils: Pupils are equal, round, and reactive to light.  Pulmonary:     Effort: Pulmonary effort is normal.  Skin:    General: Skin is warm and dry.  Neurological:     Mental Status: He is alert and oriented to person, place, and time.  Psychiatric:        Behavior: Behavior normal.     Ortho Exam awake alert and oriented x3.  Comfortable sitting.  Left forearm with a small 1 cm mass, freely mobile and nontender on the extensor surface overlying the ulnar.  It is located several inches distal to the olecranon.  No pain.  No erythema or ecchymosis.  Full range of motion of the elbow in flexion extension pronation and supination.  Neurologically intact.  Mass appears to be solid and not cystic  Specialty Comments:  No specialty comments available.  Imaging: XR Elbow 2 Views Left  Result Date: 02/16/2022 Films of the left elbow obtained in 2 projections.  There is a soft tissue mass along the proximal third of the ulna.  Bony morphology appears to be  intact.  No elbow effusion.  No acute change    PMFS History: Patient Active Problem List   Diagnosis Date Noted   Mass of left forearm 02/16/2022   Arthralgia 01/24/2022   GERD (gastroesophageal reflux disease) 11/25/2019   Hyperglycemia 11/25/2019   Gout 05/29/2017   Dyslipidemia 03/07/2014   Past Medical History:  Diagnosis Date   Hyperlipidemia     Family History  Problem Relation Age of Onset   Depression Mother    Heart attack Maternal Grandfather    Hyperlipidemia Maternal Grandfather    Stroke Maternal Grandfather    Alcohol abuse Paternal Grandfather    Cancer Paternal Grandfather     Past Surgical History:  Procedure Laterality Date   CLAVICLE SURGERY Right    ROTATOR CUFF REPAIR Right    Social History   Occupational History   Occupation: Freight forwarder  Tobacco Use   Smoking status: Never   Smokeless tobacco: Never   Vaping Use   Vaping Use: Never used  Substance and Sexual Activity   Alcohol use: Yes   Drug use: No   Sexual activity: Yes

## 2022-03-11 ENCOUNTER — Other Ambulatory Visit: Payer: Self-pay | Admitting: Family Medicine

## 2022-04-13 ENCOUNTER — Ambulatory Visit: Payer: 59 | Admitting: Family

## 2022-04-13 ENCOUNTER — Encounter: Payer: Self-pay | Admitting: Family

## 2022-04-13 VITALS — BP 152/96 | HR 69 | Temp 96.8°F | Ht 71.0 in | Wt 189.4 lb

## 2022-04-13 DIAGNOSIS — J029 Acute pharyngitis, unspecified: Secondary | ICD-10-CM

## 2022-04-13 DIAGNOSIS — H6122 Impacted cerumen, left ear: Secondary | ICD-10-CM

## 2022-04-13 DIAGNOSIS — K219 Gastro-esophageal reflux disease without esophagitis: Secondary | ICD-10-CM | POA: Diagnosis not present

## 2022-04-13 LAB — POCT RAPID STREP A (OFFICE): Rapid Strep A Screen: NEGATIVE

## 2022-04-13 NOTE — Assessment & Plan Note (Signed)
taking generic Pepcid once daily, 10mg  c/o continuous sore throat after AMOX neg for strep today advised on low acidic diet, increase Pepcid to 2 pills bid for next 5 days, if better then continue 1 pill bid

## 2022-04-13 NOTE — Progress Notes (Signed)
Patient ID: Daniel Gilbert, male    DOB: 03-30-1986, 37 y.o.   MRN: [id number]  Chief Complaint  Patient presents with   Sore Throat    Pt c/o sore throat off and on since Thanksgiving. Has tried a Z-Pack which did help at the time but throat pain has not went away. Amoxicillin given through a e- visit, which did help at the time. Pt c/o Scratchy, Red and painful, worse throughout the day.     HPI:      Sore throat:  finished the AMOX about 11-12 days ago, (took Zpack prior to that for sinus inf.) reports he took the complete course for 10d and felt better for a few days but then his throat started hurting again. Reports young kids at home with off & on illnesses. Denies any fever or cough, no sinus sx. Takes OTC Pepcid once a day for GERD. Denies dysphagia, or any epigastric pain, does report clearing his throat repeatedly.    Assessment & Plan:  1. Impacted cerumen of left ear - Verbal consent received to perform left ear lavage via Hydrogen peroxide/water mix solution. Curette used to remove visible cerumen. Pt tolerated well, complete evacuation of all cerumen obtained. Mild erythema but no bleeding noted in ear canal after procedure.  - Ear Lavage  2. Gastroesophageal reflux disease, unspecified whether esophagitis present - continuous sore throat, clearing throat frequently, advised to increase OTC Pepcid to 2 pills bid x 5d, if helping sore throat, then continue for 1-2 weeks, then reduce to 1 pill bid. Also advised on eating/drinking low acidic diet.  3. Sore throat - rapid strep neg. - took zpack for sinus inf., then AMOX for suspected strep, still with sore throat today believe r/t GERD, advised to increase Pepcid.  - POCT rapid strep A   Subjective:    Outpatient Medications Prior to Visit  Medication Sig Dispense Refill   allopurinol (ZYLOPRIM) 300 MG tablet TAKE 1 TABLET BY MOUTH EVERY DAY 90 tablet 1   famotidine (PEPCID) 10 MG tablet Take 10 mg by mouth 2 (two)  times daily.     rosuvastatin (CRESTOR) 5 MG tablet TAKE 1 TABLET (5 MG TOTAL) BY MOUTH DAILY. 90 tablet 3   No facility-administered medications prior to visit.   Past Medical History:  Diagnosis Date   Hyperlipidemia    Past Surgical History:  Procedure Laterality Date   CLAVICLE SURGERY Right    ROTATOR CUFF REPAIR Right    No Known Allergies    Objective:    Physical Exam Vitals and nursing note reviewed.  Constitutional:      General: He is not in acute distress.    Appearance: Normal appearance.  HENT:     Head: Normocephalic.     Mouth/Throat:     Mouth: Mucous membranes are moist.     Pharynx: Posterior oropharyngeal erythema (mild) present. No pharyngeal swelling, oropharyngeal exudate or uvula swelling.  Cardiovascular:     Rate and Rhythm: Normal rate and regular rhythm.  Pulmonary:     Effort: Pulmonary effort is normal.     Breath sounds: Normal breath sounds.  Musculoskeletal:        General: Normal range of motion.     Cervical back: Normal range of motion.  Lymphadenopathy:     Cervical: No cervical adenopathy.  Skin:    General: Skin is warm and dry.  Neurological:     Mental Status: He is alert and oriented to person, place, and time.  Psychiatric:        Mood and Affect: Mood normal.    BP (!) 152/96 (BP Location: Left Arm, Patient Position: Sitting, Cuff Size: Large)   Pulse 69   Temp (!) 96.8 F (36 C) (Temporal)   Ht 5\' 11"  (1.803 m)   Wt 189 lb 6 oz (85.9 kg)   SpO2 99%   BMI 26.41 kg/m  Wt Readings from Last 3 Encounters:  04/13/22 189 lb 6 oz (85.9 kg)  01/24/22 186 lb (84.4 kg)  01/05/22 187 lb 6.4 oz (85 kg)       Jeanie Sewer, NP

## 2022-09-05 ENCOUNTER — Other Ambulatory Visit: Payer: Self-pay | Admitting: Family Medicine

## 2022-11-26 ENCOUNTER — Encounter: Payer: Self-pay | Admitting: Family Medicine

## 2022-11-28 ENCOUNTER — Other Ambulatory Visit: Payer: Self-pay | Admitting: *Deleted

## 2022-11-28 MED ORDER — ROSUVASTATIN CALCIUM 5 MG PO TABS: 5.0000 mg | ORAL_TABLET | Freq: Every day | ORAL | 1 refills | Status: DC

## 2023-01-26 ENCOUNTER — Ambulatory Visit: Payer: 59 | Admitting: Family Medicine

## 2023-01-26 ENCOUNTER — Encounter: Payer: Self-pay | Admitting: Family Medicine

## 2023-01-26 VITALS — BP 122/79 | HR 68 | Temp 97.5°F | Ht 71.0 in | Wt 188.0 lb

## 2023-01-26 DIAGNOSIS — Z0000 Encounter for general adult medical examination without abnormal findings: Secondary | ICD-10-CM

## 2023-01-26 DIAGNOSIS — E785 Hyperlipidemia, unspecified: Secondary | ICD-10-CM | POA: Diagnosis not present

## 2023-01-26 DIAGNOSIS — Z23 Encounter for immunization: Secondary | ICD-10-CM | POA: Diagnosis not present

## 2023-01-26 DIAGNOSIS — J309 Allergic rhinitis, unspecified: Secondary | ICD-10-CM | POA: Diagnosis not present

## 2023-01-26 DIAGNOSIS — Z0001 Encounter for general adult medical examination with abnormal findings: Secondary | ICD-10-CM

## 2023-01-26 DIAGNOSIS — R2232 Localized swelling, mass and lump, left upper limb: Secondary | ICD-10-CM

## 2023-01-26 DIAGNOSIS — M109 Gout, unspecified: Secondary | ICD-10-CM

## 2023-01-26 DIAGNOSIS — R739 Hyperglycemia, unspecified: Secondary | ICD-10-CM

## 2023-01-26 DIAGNOSIS — K219 Gastro-esophageal reflux disease without esophagitis: Secondary | ICD-10-CM

## 2023-01-26 LAB — COMPREHENSIVE METABOLIC PANEL
ALT: 28 U/L (ref 0–53)
AST: 24 U/L (ref 0–37)
Albumin: 4.6 g/dL (ref 3.5–5.2)
Alkaline Phosphatase: 38 U/L — ABNORMAL LOW (ref 39–117)
BUN: 23 mg/dL (ref 6–23)
CO2: 28 meq/L (ref 19–32)
Calcium: 9.5 mg/dL (ref 8.4–10.5)
Chloride: 103 meq/L (ref 96–112)
Creatinine, Ser: 1.17 mg/dL (ref 0.40–1.50)
GFR: 79.29 mL/min (ref >60.00)
Glucose, Bld: 105 mg/dL — ABNORMAL HIGH (ref 70–99)
Potassium: 4.5 meq/L (ref 3.5–5.1)
Sodium: 140 meq/L (ref 135–145)
Total Bilirubin: 0.5 mg/dL (ref 0.2–1.2)
Total Protein: 7.3 g/dL (ref 6.0–8.3)

## 2023-01-26 LAB — HEMOGLOBIN A1C: Hgb A1c MFr Bld: 5.7 % (ref 4.6–6.5)

## 2023-01-26 LAB — CBC
HCT: 47.8 % (ref 39.0–52.0)
Hemoglobin: 15.4 g/dL (ref 13.0–17.0)
MCHC: 32.2 g/dL (ref 30.0–36.0)
MCV: 90.3 fL (ref 78.0–100.0)
Platelets: 201 10*3/uL (ref 150.0–400.0)
RBC: 5.3 Mil/uL (ref 4.22–5.81)
RDW: 14 % (ref 11.5–15.5)
WBC: 4.5 10*3/uL (ref 4.0–10.5)

## 2023-01-26 LAB — LIPID PANEL
Cholesterol: 218 mg/dL — ABNORMAL HIGH (ref 0–200)
HDL: 60.4 mg/dL (ref >39.00)
LDL Cholesterol: 138 mg/dL — ABNORMAL HIGH (ref 0–99)
NonHDL: 157.87
Total CHOL/HDL Ratio: 4
Triglycerides: 100 mg/dL (ref 0.0–149.0)
VLDL: 20 mg/dL (ref 0.0–40.0)

## 2023-01-26 LAB — TSH: TSH: 1.42 u[IU]/mL (ref 0.35–5.50)

## 2023-01-26 LAB — URIC ACID: Uric Acid, Serum: 6.1 mg/dL (ref 4.0–7.8)

## 2023-01-26 NOTE — Assessment & Plan Note (Signed)
He did see orthopedics for this about a year ago.  They at that point elected for watchful waiting however he is now ready for excision.  Advised him to call to schedule appointment soon.

## 2023-01-26 NOTE — Assessment & Plan Note (Signed)
Check A1c 

## 2023-01-26 NOTE — Patient Instructions (Addendum)
It was very nice to see you today!  We will give you your flu and tetanus shot today.  We will check blood work.   Try taking Flonase to see if this helps with your postnasal drip and irritated throat.  Keep working on diet and exercise.   Return in about 1 year (around 01/26/2024) for Annual Physical.   Take care, Dr Jimmey Ralph  PLEASE NOTE:  If you had any lab tests, please let us know if you have not heard back within a few days. You may see your results on mychart before we have a chance to review them but we will give you a call once they are reviewed by [place].   If we ordered any referrals today, please let us know if you have not heard from their office within the next week.   If you had any urgent prescriptions sent in today, please check with the pharmacy within an hour of our visit to make sure the prescription was transmitted appropriately.   Please try these tips to maintain a healthy lifestyle:  Eat at least 3 REAL meals and 1-2 snacks per day.  Aim for no more than 5 hours between eating.  If you eat breakfast, please do so within one hour of getting up.   Each meal should contain half fruits/vegetables, one quarter protein, and one quarter carbs (no bigger than a computer mouse)  Cut down on sweet beverages. This includes juice, soda, and sweet tea.   Drink at least 1 glass of water with each meal and aim for at least 8 glasses per day  Exercise at least 150 minutes every week.    Preventive Care 14-78 Years Old, Male Preventive care refers to lifestyle choices and visits with your health care provider that can promote health and wellness. Preventive care visits are also called wellness exams. What can I expect for my preventive care visit? Counseling During your preventive care visit, your health care provider may ask about your: Medical history, including: Past medical problems. Family medical history. Current health, including: Emotional well-being. Home life  and relationship well-being. Sexual activity. Lifestyle, including: Alcohol, nicotine or tobacco, and drug use. Access to firearms. Diet, exercise, and sleep habits. Safety issues such as seatbelt and bike helmet use. Sunscreen use. Work and work Astronomer. Physical exam Your health care provider may check your: Height and weight. These may be used to calculate your BMI (body mass index). BMI is a measurement that tells if you are at a healthy weight. Waist circumference. This measures the distance around your waistline. This measurement also tells if you are at a healthy weight and may help predict your risk of certain diseases, such as type 2 diabetes and high blood pressure. Heart rate and blood pressure. Body temperature. Skin for abnormal spots. What immunizations do I need?  Vaccines are usually given at various ages, according to a schedule. Your health care provider will recommend vaccines for you based on your age, medical history, and lifestyle or other factors, such as travel or where you work. What tests do I need? Screening Your health care provider may recommend screening tests for certain conditions. This may include: Lipid and cholesterol levels. Diabetes screening. This is done by checking your blood sugar (glucose) after you have not eaten for a while (fasting). Hepatitis B test. Hepatitis C test. HIV (human immunodeficiency virus) test. STI (sexually transmitted infection) testing, if you are at risk. Talk with your health care provider about your test results,  treatment options, and if necessary, the need for more tests. Follow these instructions at home: Eating and drinking  Eat a healthy diet that includes fresh fruits and vegetables, whole grains, lean protein, and low-fat dairy products. Drink enough fluid to keep your urine pale yellow. Take vitamin and mineral supplements as recommended by your health care provider. Do not drink alcohol if your health  care provider tells you not to drink. If you drink alcohol: Limit how much you have to 0-2 drinks a day. Know how much alcohol is in your drink. In the [place] one drink equals one 12 oz bottle of beer (355 mL), one 5 oz glass of wine (148 mL), or one 1 oz glass of hard liquor (44 mL). Lifestyle Brush your teeth every morning and night with fluoride toothpaste. Floss one time each day. Exercise for at least 30 minutes 5 or more days each week. Do not use any products that contain nicotine or tobacco. These products include cigarettes, chewing tobacco, and vaping devices, such as e-cigarettes. If you need help quitting, ask your health care provider. Do not use drugs. If you are sexually active, practice safe sex. Use a condom or other form of protection to prevent STIs. Find healthy ways to manage stress, such as: Meditation, yoga, or listening to music. Journaling. Talking to a trusted person. Spending time with friends and family. Minimize exposure to UV radiation to reduce your risk of skin cancer. Safety Always wear your seat belt while driving or riding in a vehicle. Do not drive: If you have been drinking alcohol. Do not ride with someone who has been drinking. If you have been using any mind-altering substances or drugs. While texting. When you are tired or distracted. Wear a helmet and other protective equipment during sports activities. If you have firearms in your house, make sure you follow all gun safety procedures. Seek help if you have been physically or sexually abused. What's next? Go to your health care provider once a year for an annual wellness visit. Ask your health care provider how often you should have your eyes and teeth checked. Stay up to date on all vaccines. This information is not intended to replace advice given to you by your health care provider. Make sure you discuss any questions you have with your health care provider. Document Revised: 09/23/2020  Document Reviewed: 09/23/2020 Elsevier Patient Education  2024 [employer].

## 2023-01-26 NOTE — Assessment & Plan Note (Signed)
He is taking a daily Zyrtec however still having persistent issues with sore throat and tonsil stones.  He did have endoscopic evaluation a few years ago with ENT which did not show any significant abnormalities.  Still having persistent symptoms.  Will add on Flonase daily.  If not improving would consider referral to allergist or back to ENT.

## 2023-01-26 NOTE — Progress Notes (Signed)
Chief Complaint:  Daniel Gilbert is a 37 y.o. male who presents today for his annual comprehensive physical exam.    Assessment/Plan:  Chronic Problems Addressed Today: Dyslipidemia Check lipids.  He is only been taking Crestor 2.5 mg daily.  GERD (gastroesophageal reflux disease) On Pepcid 10 mg twice daily.  Gout Stable.  No recent flares.  He is taking allopurinol 150 mg daily.  Will check uric acid level today.  Hyperglycemia Check A1c.  Mass of left forearm He did see orthopedics for this about a year ago.  They at that point elected for watchful waiting however he is now ready for excision.  Advised him to call to schedule appointment soon.  Allergic rhinitis He is taking a daily Zyrtec however still having persistent issues with sore throat and tonsil stones.  He did have endoscopic evaluation a few years ago with ENT which did not show any significant abnormalities.  Still having persistent symptoms.  Will add on Flonase daily.  If not improving would consider referral to allergist or back to ENT.   Preventative Healthcare: Flu shot and Tetanus, Diphtheria, and Pertussis (Tdap) given today. Check labs.   Patient Counseling(The following topics were reviewed and/or handout was given):  -Nutrition: Stressed importance of moderation in sodium/caffeine intake, saturated fat and cholesterol, caloric balance, sufficient intake of fresh fruits, vegetables, and fiber.  -Stressed the importance of regular exercise.   -Substance Abuse: Discussed cessation/primary prevention of tobacco, alcohol, or other drug use; driving or other dangerous activities under the influence; availability of treatment for abuse.   -Injury prevention: Discussed safety belts, safety helmets, smoke detector, smoking near bedding or upholstery.   -Sexuality: Discussed sexually transmitted diseases, partner selection, use of condoms, avoidance of unintended pregnancy and contraceptive alternatives.    -Dental health: Discussed importance of regular tooth brushing, flossing, and dental visits.  -Health maintenance and immunizations reviewed. Please refer to Health maintenance section.  Return to care in 1 year for next preventative visit.     Subjective:  HPI:  He has no acute complaints today. See Assessment / plan for status of chronic conditions.   He has been having intermittent issues with sore throat for the last several months. He has seen ENT for this as well and had an endoscopic evaluation that did not show any significant abnormalities. He did see a provider here earlier this year for this and was instructed to increase his dose of Pepcid. He does not think that this has helped significantly.   Lifestyle Diet: Balanced. Plenty of fruits and vegetables.  Exercise: Trying to exercise daily. Playing soccer.      01/26/2023    8:14 AM  Depression screen PHQ 2/9  Decreased Interest 0  Down, Depressed, Hopeless 0  PHQ - 2 Score 0    Health Maintenance Due  Topic Date Due   INFLUENZA VACCINE  11/10/2022   DTaP/Tdap/Td (2 - Td or Tdap) 12/05/2022     ROS: Per HPI, otherwise a complete review of systems was negative.   PMH:  The following were reviewed and entered/updated in epic: Past Medical History:  Diagnosis Date   Hyperlipidemia    Patient Active Problem List   Diagnosis Date Noted   Allergic rhinitis 01/26/2023   Mass of left forearm 02/16/2022   Arthralgia 01/24/2022   GERD (gastroesophageal reflux disease) 11/25/2019   Hyperglycemia 11/25/2019   Gout 05/29/2017   Dyslipidemia 03/07/2014   Past Surgical History:  Procedure Laterality Date   CLAVICLE SURGERY Right  ROTATOR CUFF REPAIR Right     Family History  Problem Relation Age of Onset   Depression Mother    Heart attack Maternal Grandfather    Hyperlipidemia Maternal Grandfather    Stroke Maternal Grandfather    Alcohol abuse Paternal Grandfather    Cancer Paternal Grandfather      Medications- reviewed and updated Current Outpatient Medications  Medication Sig Dispense Refill   allopurinol (ZYLOPRIM) 300 MG tablet TAKE 1 TABLET BY MOUTH EVERY DAY 90 tablet 1   famotidine (PEPCID) 10 MG tablet Take 10 mg by mouth 2 (two) times daily.     rosuvastatin (CRESTOR) 5 MG tablet Take 1 tablet (5 mg total) by mouth daily. 90 tablet 1   No current facility-administered medications for this visit.    Allergies-reviewed and updated No Known Allergies  Social History   Socioeconomic History   Marital status: Married    Spouse name: Not on file   Number of children: 1   Years of education: Not on file   Highest education level: Not on file  Occupational History   Occupation: Production designer, theatre/television/film  Tobacco Use   Smoking status: Never   Smokeless tobacco: Never  Vaping Use   Vaping status: Never Used  Substance and Sexual Activity   Alcohol use: Yes   Drug use: No   Sexual activity: Yes  Other Topics Concern   Not on file  Social History Narrative   Not on file   Social Determinants of Health   Financial Resource Strain: Not on file  Food Insecurity: Not on file  Transportation Needs: Not on file  Physical Activity: Not on file  Stress: Not on file  Social Connections: Not on file        Objective:  Physical Exam: BP 122/79   Pulse 68   Temp (!) 97.5 F (36.4 C) (Temporal)   Ht 5\' 11"  (1.803 m)   Wt 188 lb (85.3 kg)   SpO2 100%   BMI 26.22 kg/m   Body mass index is 26.22 kg/m. Wt Readings from Last 3 Encounters:  01/26/23 188 lb (85.3 kg)  04/13/22 189 lb 6 oz (85.9 kg)  01/24/22 186 lb (84.4 kg)   Gen: NAD, resting comfortably HEENT: TMs normal bilaterally. OP erythematous. No thyromegaly noted.  CV: RRR with no murmurs appreciated Pulm: NWOB, CTAB with no crackles, wheezes, or rhonchi GI: Normal bowel sounds present. Soft, Nontender, Nondistended. MSK: no edema, cyanosis, or clubbing noted Skin: warm, dry Neuro: CN2-12 grossly intact.  Strength 5/5 in upper and lower extremities. Reflexes symmetric and intact bilaterally.  Psych: Normal affect and thought content     Ahmad Vanwey M. Jimmey Ralph, MD 01/26/2023 8:48 AM

## 2023-01-26 NOTE — Assessment & Plan Note (Signed)
Stable.  No recent flares.  He is taking allopurinol 150 mg daily.  Will check uric acid level today.

## 2023-01-26 NOTE — Assessment & Plan Note (Signed)
Check lipids.  He is only been taking Crestor 2.5 mg daily.

## 2023-01-26 NOTE — Assessment & Plan Note (Signed)
On Pepcid 10 mg twice daily.

## 2023-02-01 NOTE — Progress Notes (Signed)
Cholesterol is up a bit since last year.  Recommend he go back to Crestor 5 mg daily.  Please send in new prescription if needed.   His A1c is up slightly but overall stable compared to last year.  The rest of his labs are all at goal.  Do not need to make any other changes to his treatment plan at this time.  He should continue to work on diet and exercise and we can recheck everything in a year or so.

## 2023-03-03 ENCOUNTER — Other Ambulatory Visit (INDEPENDENT_AMBULATORY_CARE_PROVIDER_SITE_OTHER): Payer: 59

## 2023-03-03 ENCOUNTER — Encounter: Payer: Self-pay | Admitting: Physician Assistant

## 2023-03-03 ENCOUNTER — Ambulatory Visit: Payer: 59 | Admitting: Physician Assistant

## 2023-03-03 DIAGNOSIS — R2231 Localized swelling, mass and lump, right upper limb: Secondary | ICD-10-CM

## 2023-03-03 DIAGNOSIS — R2232 Localized swelling, mass and lump, left upper limb: Secondary | ICD-10-CM

## 2023-03-03 NOTE — Progress Notes (Signed)
Office Visit Note   Patient: Daniel Gilbert           Date of Birth: Mar 13, 1986           MRN: [id number] Visit Date: 03/03/2023              Requested by: Ardith Dark, MD [address] [place],  [place] [address] PCP: Ardith Dark, MD   Assessment & Plan: Visit Diagnoses:  1. Elbow mass, left   2. Elbow mass, right     Plan: Pleasant 37 year old gentleman with open 2-year history of left elbow mass.  First saw Dr. Cleophas Dunker for this about a year ago who felt it was completely benign did not have any evidence of calcification on x-rays.  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  He did present to the patient at that time the options of observing this or removal.  Patient follows up today and is considering removal.  He thinks is maybe gotten a little bit bigger it is not painful he does not have any neuropathic symptoms.  He just says that he notices it all the time especially when he is placing weight in this area.  Patient was referred to Dr. August Saucer today for evaluation and discussion of treatment  Follow-Up Instructions: with Dr. August Saucer  Orders:  Orders Placed This Encounter  Procedures   XR Elbow Complete Right (3+View)   XR Elbow Complete Left (3+View)   No orders of the defined types were placed in this encounter.     Procedures: No procedures performed   Clinical Data: No additional findings.   Subjective: Chief Complaint  Patient presents with   Left Elbow - Mass   Right Elbow - Mass    HPI Patient is a plan 37 year old gentleman who comes in today for follow-up on a left elbow mass.  Overall he is had this for about 2-1/2 years denies any injury.  He was last seen for this about a year ago by Dr. Cleophas Dunker.  Dr. Cleophas Dunker suggested observation versus removal.  Patient really was not having any pain so he decided with observation.  Does not have any pain or paresthesias but does notice this quite a bit especially when he is putting  pressure on his arm.  He does not think it has really gotten any bigger maybe slightly.  Does say he is constantly aware of it and has become somewhat of a nuisance.  Also complains of a very small feeling of a mass on his right elbow but again not painful.  Review of Systems  All other systems reviewed and are negative.    Objective: Vital Signs: There were no vitals taken for this visit.  Physical Exam Constitutional:      Appearance: Normal appearance.  Pulmonary:     Effort: Pulmonary effort is normal.  Skin:    General: Skin is warm and dry.  Neurological:     General: No focal deficit present.     Mental Status: He is alert.  Psychiatric:        Mood and Affect: Mood normal.        Behavior: Behavior normal.     Ortho Exam Right elbow: He has full extension and flexion he is neurovascularly intact good grip strength good supination pronation nontender over the medial lateral epicondyles.  Small fullness over the olecranon could be just a thickened bursa.   Left elbow: He has full extension and flexion of his elbow.  Good supination pronation.  He is neurovascular intact distally with good grip strength.  Pulses are intact.  Strength is intact and equivalent to the right side.  He does have a palpable mobile 2 1/2 cm mass along the proximal third of the elbow.  Appears to be subcutaneous. Specialty Comments:  No specialty comments available.  Imaging: No results found.   PMFS History: Patient Active Problem List   Diagnosis Date Noted   Allergic rhinitis 01/26/2023   Elbow mass, left 02/16/2022   Arthralgia 01/24/2022   GERD (gastroesophageal reflux disease) 11/25/2019   Hyperglycemia 11/25/2019   Gout 05/29/2017   Dyslipidemia 03/07/2014   Past Medical History:  Diagnosis Date   Hyperlipidemia     Family History  Problem Relation Age of Onset   Depression Mother    Heart attack Maternal Grandfather    Hyperlipidemia Maternal Grandfather    Stroke Maternal  Grandfather    Alcohol abuse Paternal Grandfather    Cancer Paternal Grandfather     Past Surgical History:  Procedure Laterality Date   CLAVICLE SURGERY Right    ROTATOR CUFF REPAIR Right    Social History   Occupational History   Occupation: Production designer, theatre/television/film  Tobacco Use   Smoking status: Never   Smokeless tobacco: Never  Vaping Use   Vaping status: Never Used  Substance and Sexual Activity   Alcohol use: Yes   Drug use: No   Sexual activity: Yes

## 2023-03-03 NOTE — Progress Notes (Deleted)
    Office Visit Note   Patient: Daniel Gilbert           Date of Birth: Aug 19, 1985           MRN: [id number] Visit Date: 03/03/2023              Requested by: Ardith Dark, MD [address] [place],  [place] [address] PCP: Ardith Dark, MD  No chief complaint on file.     HPI: ***  Assessment & Plan: Visit Diagnoses: No diagnosis found.  Plan: ***  Follow-Up Instructions: No follow-ups on file.   Ortho Exam  Patient is alert, oriented, no adenopathy, well-dressed, normal affect, normal respiratory effort. ***  Imaging: No results found. No images are attached to the encounter.  Labs: Lab Results  Component Value Date   HGBA1C 5.7 01/26/2023   HGBA1C 5.6 01/24/2022   HGBA1C 5.5 01/21/2021   LABURIC 6.1 01/26/2023   LABURIC 4.7 01/24/2022   LABURIC 5.2 01/21/2021     Lab Results  Component Value Date   ALBUMIN 4.6 01/26/2023   ALBUMIN 4.9 01/24/2022   ALBUMIN 4.8 01/21/2021    No results found for: "MG" No results found for: "VD25OH"  No results found for: "PREALBUMIN"    Latest Ref Rng & Units 01/26/2023    9:01 AM 01/24/2022    9:00 AM 01/21/2021    1:42 PM  CBC EXTENDED  WBC 4.0 - 10.5 K/uL 4.5  4.7  6.7   RBC 4.22 - 5.81 Mil/uL 5.30  4.98  5.10   Hemoglobin 13.0 - 17.0 g/dL 19.1  47.8  29.5   HCT 39.0 - 52.0 % 47.8  44.4  45.7   Platelets 150.0 - 400.0 K/uL 201.0  206.0  210.0      There is no height or weight on file to calculate BMI.  Orders:  No orders of the defined types were placed in this encounter.  No orders of the defined types were placed in this encounter.    Procedures: No procedures performed  Clinical Data: No additional findings.  ROS:  All other systems negative, except as noted in the HPI. Review of Systems  Objective: Vital Signs: There were no vitals taken for this visit.  Specialty Comments:  No specialty comments available.  PMFS History: Patient Active Problem List   Diagnosis Date  Noted   Allergic rhinitis 01/26/2023   Mass of left forearm 02/16/2022   Arthralgia 01/24/2022   GERD (gastroesophageal reflux disease) 11/25/2019   Hyperglycemia 11/25/2019   Gout 05/29/2017   Dyslipidemia 03/07/2014   Past Medical History:  Diagnosis Date   Hyperlipidemia     Family History  Problem Relation Age of Onset   Depression Mother    Heart attack Maternal Grandfather    Hyperlipidemia Maternal Grandfather    Stroke Maternal Grandfather    Alcohol abuse Paternal Grandfather    Cancer Paternal Grandfather     Past Surgical History:  Procedure Laterality Date   CLAVICLE SURGERY Right    ROTATOR CUFF REPAIR Right    Social History   Occupational History   Occupation: Production designer, theatre/television/film  Tobacco Use   Smoking status: Never   Smokeless tobacco: Never  Vaping Use   Vaping status: Never Used  Substance and Sexual Activity   Alcohol use: Yes   Drug use: No   Sexual activity: Yes

## 2023-03-20 ENCOUNTER — Other Ambulatory Visit: Payer: Self-pay | Admitting: Surgical

## 2023-03-20 ENCOUNTER — Telehealth: Payer: Self-pay | Admitting: Orthopedic Surgery

## 2023-03-20 DIAGNOSIS — R2232 Localized swelling, mass and lump, left upper limb: Secondary | ICD-10-CM | POA: Diagnosis not present

## 2023-03-20 MED ORDER — CELECOXIB 100 MG PO CAPS: 100.0000 mg | ORAL_CAPSULE | Freq: Two times a day (BID) | ORAL | 0 refills | Status: DC

## 2023-03-20 MED ORDER — HYDROCODONE-ACETAMINOPHEN 5-325 MG PO TABS: 1.0000 | ORAL_TABLET | Freq: Four times a day (QID) | ORAL | 0 refills | Status: DC | PRN

## 2023-03-20 NOTE — Telephone Encounter (Signed)
Called and discussed

## 2023-03-20 NOTE — Telephone Encounter (Signed)
Patient called and said that he didn't have instructions on the medications. CB#[phone]

## 2023-03-27 ENCOUNTER — Ambulatory Visit (INDEPENDENT_AMBULATORY_CARE_PROVIDER_SITE_OTHER): Payer: 59 | Admitting: Orthopedic Surgery

## 2023-03-27 ENCOUNTER — Encounter: Payer: Self-pay | Admitting: Orthopedic Surgery

## 2023-03-27 DIAGNOSIS — R2232 Localized swelling, mass and lump, left upper limb: Secondary | ICD-10-CM

## 2023-03-27 NOTE — Progress Notes (Signed)
   Post-Op Visit Note   Patient: Daniel Gilbert           Date of Birth: 01-05-1986           MRN: [id number] Visit Date: 03/27/2023 PCP: Ardith Dark, MD   Assessment & Plan:  Chief Complaint:  Chief Complaint  Patient presents with   Left Elbow - Routine Post Op    03/20/2023 excision left elbow mass   Visit Diagnoses:  1. Elbow mass, left     Plan: Natavius is a 37 year old patient who is now about a week out from left elbow lipoma excision.  Overall he is been doing well.  On exam the incision is intact.  Closed with nonabsorbable suture.  Plan at this time is okay for cardio type activity.  Would hold off on a lot of hard resistance weight training for least a couple of weeks just to let that tensile strength lock in to get that incision closed.  Okay for him to remove the Steri-Strips in 1 week.  No submersion.  Follow-up with [place] as needed.  Follow-Up Instructions: No follow-ups on file.   Orders:  No orders of the defined types were placed in this encounter.  No orders of the defined types were placed in this encounter.   Imaging: No results found.  PMFS History: Patient Active Problem List   Diagnosis Date Noted   Allergic rhinitis 01/26/2023   Elbow mass, left 02/16/2022   Arthralgia 01/24/2022   GERD (gastroesophageal reflux disease) 11/25/2019   Hyperglycemia 11/25/2019   Gout 05/29/2017   Dyslipidemia 03/07/2014   Past Medical History:  Diagnosis Date   Hyperlipidemia     Family History  Problem Relation Age of Onset   Depression Mother    Heart attack Maternal Grandfather    Hyperlipidemia Maternal Grandfather    Stroke Maternal Grandfather    Alcohol abuse Paternal Grandfather    Cancer Paternal Grandfather     Past Surgical History:  Procedure Laterality Date   CLAVICLE SURGERY Right    ROTATOR CUFF REPAIR Right    Social History   Occupational History   Occupation: Production designer, theatre/television/film  Tobacco Use   Smoking status: Never   Smokeless  tobacco: Never  Vaping Use   Vaping status: Never Used  Substance and Sexual Activity   Alcohol use: Yes   Drug use: No   Sexual activity: Yes

## 2023-04-10 ENCOUNTER — Ambulatory Visit: Payer: 59 | Admitting: Family

## 2023-05-26 ENCOUNTER — Other Ambulatory Visit: Payer: Self-pay | Admitting: Family Medicine

## 2023-07-11 ENCOUNTER — Encounter: Payer: Self-pay | Admitting: Orthopedic Surgery

## 2023-08-07 ENCOUNTER — Encounter: Payer: Self-pay | Admitting: Family Medicine

## 2023-08-08 ENCOUNTER — Other Ambulatory Visit: Payer: Self-pay | Admitting: *Deleted

## 2023-08-08 MED ORDER — ALLOPURINOL 300 MG PO TABS: 300.0000 mg | ORAL_TABLET | Freq: Every day | ORAL | 1 refills | Status: DC

## 2024-01-29 ENCOUNTER — Encounter: Payer: Self-pay | Admitting: Family Medicine

## 2024-01-29 ENCOUNTER — Ambulatory Visit: Payer: 59 | Admitting: Family Medicine

## 2024-01-29 VITALS — BP 120/88 | HR 69 | Temp 97.5°F | Ht 71.0 in | Wt 186.2 lb

## 2024-01-29 DIAGNOSIS — M545 Low back pain, unspecified: Secondary | ICD-10-CM | POA: Diagnosis not present

## 2024-01-29 DIAGNOSIS — R739 Hyperglycemia, unspecified: Secondary | ICD-10-CM | POA: Diagnosis not present

## 2024-01-29 DIAGNOSIS — G8929 Other chronic pain: Secondary | ICD-10-CM

## 2024-01-29 DIAGNOSIS — Z0001 Encounter for general adult medical examination with abnormal findings: Secondary | ICD-10-CM

## 2024-01-29 DIAGNOSIS — E785 Hyperlipidemia, unspecified: Secondary | ICD-10-CM | POA: Diagnosis not present

## 2024-01-29 DIAGNOSIS — Z23 Encounter for immunization: Secondary | ICD-10-CM

## 2024-01-29 DIAGNOSIS — K219 Gastro-esophageal reflux disease without esophagitis: Secondary | ICD-10-CM

## 2024-01-29 DIAGNOSIS — M2550 Pain in unspecified joint: Secondary | ICD-10-CM

## 2024-01-29 DIAGNOSIS — M109 Gout, unspecified: Secondary | ICD-10-CM | POA: Diagnosis not present

## 2024-01-29 LAB — CBC
HCT: 45.7 % (ref 39.0–52.0)
Hemoglobin: 15.3 g/dL (ref 13.0–17.0)
MCHC: 33.4 g/dL (ref 30.0–36.0)
MCV: 88 fl (ref 78.0–100.0)
Platelets: 209 K/uL (ref 150.0–400.0)
RBC: 5.2 Mil/uL (ref 4.22–5.81)
RDW: 13.3 % (ref 11.5–15.5)
WBC: 4.3 K/uL (ref 4.0–10.5)

## 2024-01-29 LAB — COMPREHENSIVE METABOLIC PANEL WITH GFR
ALT: 26 U/L (ref 0–53)
AST: 24 U/L (ref 0–37)
Albumin: 4.8 g/dL (ref 3.5–5.2)
Alkaline Phosphatase: 36 U/L — ABNORMAL LOW (ref 39–117)
BUN: 18 mg/dL (ref 6–23)
CO2: 28 meq/L (ref 19–32)
Calcium: 9.3 mg/dL (ref 8.4–10.5)
Chloride: 101 meq/L (ref 96–112)
Creatinine, Ser: 1.09 mg/dL (ref 0.40–1.50)
GFR: 85.71 mL/min (ref >60.00)
Glucose, Bld: 107 mg/dL — ABNORMAL HIGH (ref 70–99)
Potassium: 4.3 meq/L (ref 3.5–5.1)
Sodium: 138 meq/L (ref 135–145)
Total Bilirubin: 0.5 mg/dL (ref 0.2–1.2)
Total Protein: 7.5 g/dL (ref 6.0–8.3)

## 2024-01-29 LAB — LIPID PANEL
Cholesterol: 214 mg/dL — ABNORMAL HIGH (ref 0–200)
HDL: 58.6 mg/dL (ref >39.00)
LDL Cholesterol: 126 mg/dL — ABNORMAL HIGH (ref 0–99)
NonHDL: 155.58
Total CHOL/HDL Ratio: 4
Triglycerides: 147 mg/dL (ref 0.0–149.0)
VLDL: 29.4 mg/dL (ref 0.0–40.0)

## 2024-01-29 LAB — URIC ACID: Uric Acid, Serum: 5.7 mg/dL (ref 4.0–7.8)

## 2024-01-29 LAB — TSH: TSH: 1.27 u[IU]/mL (ref 0.35–5.50)

## 2024-01-29 LAB — HEMOGLOBIN A1C: Hgb A1c MFr Bld: 5.7 % (ref 4.6–6.5)

## 2024-01-29 NOTE — Assessment & Plan Note (Signed)
 Patient with multifocal joint and back pain.  We are referring to sports medicine as above.

## 2024-01-29 NOTE — Patient Instructions (Signed)
 It was very nice to see you today!  VISIT SUMMARY: You came in for your annual physical exam. We discussed your ongoing back and shoulder pain, your diet and exercise habits, and your current medications. We also administered a flu shot and planned for blood work to monitor your cholesterol levels.  YOUR PLAN: CHRONIC LOW BACK PAIN, POST-LUMBAR SURGERY WITH REFERRED HIP AND GROIN PAIN: You have ongoing back pain that radiates to your hip and groin, which worsens with physical activity. -We will refer you to see sports medicine  HYPERLIPIDEMIA: Your cholesterol levels need to be monitored to ensure they are under control. -Continue taking Crestor  5 mg, even if it's at a half dose. -We will perform blood work to check your cholesterol levels.  GOUT: Your gout is being managed with allopurinol . -Continue taking allopurinol  300 mg at half dose daily.  GENERAL HEALTH MAINTENANCE: We discussed your overall health and wellness. -You received a flu shot today. -Try to eat more fruits and vegetables and maintain regular physical activity, including weight training and cardio. -We will perform blood work to monitor your cholesterol levels.  Return in about 1 year (around 01/28/2025) for Annual Physical.   Take care, Dr Kennyth  PLEASE NOTE:  If you had any lab tests, please let us  know if you have not heard back within a few days. You may see your results on mychart before we have a chance to review them but we will give you a call once they are reviewed by us .   If we ordered any referrals today, please let us  know if you have not heard from their office within the next week.   If you had any urgent prescriptions sent in today, please check with the pharmacy within an hour of our visit to make sure the prescription was transmitted appropriately.   Please try these tips to maintain a healthy lifestyle:  Eat at least 3 REAL meals and 1-2 snacks per day.  Aim for no more than 5 hours between  eating.  If you eat breakfast, please do so within one hour of getting up.   Each meal should contain half fruits/vegetables, one quarter protein, and one quarter carbs (no bigger than a computer mouse)  Cut down on sweet beverages. This includes juice, soda, and sweet tea.   Drink at least 1 glass of water with each meal and aim for at least 8 glasses per day  Exercise at least 150 minutes every week.    Preventive Care 31-64 Years Old, Male Preventive care refers to lifestyle choices and visits with your health care provider that can promote health and wellness. Preventive care visits are also called wellness exams. What can I expect for my preventive care visit? Counseling During your preventive care visit, your health care provider may ask about your: Medical history, including: Past medical problems. Family medical history. Current health, including: Emotional well-being. Home life and relationship well-being. Sexual activity. Lifestyle, including: Alcohol, nicotine or tobacco, and drug use. Access to firearms. Diet, exercise, and sleep habits. Safety issues such as seatbelt and bike helmet use. Sunscreen use. Work and work Astronomer. Physical exam Your health care provider may check your: Height and weight. These may be used to calculate your BMI (body mass index). BMI is a measurement that tells if you are at a healthy weight. Waist circumference. This measures the distance around your waistline. This measurement also tells if you are at a healthy weight and may help predict your risk of  certain diseases, such as type 2 diabetes and high blood pressure. Heart rate and blood pressure. Body temperature. Skin for abnormal spots. What immunizations do I need?  Vaccines are usually given at various ages, according to a schedule. Your health care provider will recommend vaccines for you based on your age, medical history, and lifestyle or other factors, such as travel or  where you work. What tests do I need? Screening Your health care provider may recommend screening tests for certain conditions. This may include: Lipid and cholesterol levels. Diabetes screening. This is done by checking your blood sugar (glucose) after you have not eaten for a while (fasting). Hepatitis B test. Hepatitis C test. HIV (human immunodeficiency virus) test. STI (sexually transmitted infection) testing, if you are at risk. Talk with your health care provider about your test results, treatment options, and if necessary, the need for more tests. Follow these instructions at home: Eating and drinking  Eat a healthy diet that includes fresh fruits and vegetables, whole grains, lean protein, and low-fat dairy products. Drink enough fluid to keep your urine pale yellow. Take vitamin and mineral supplements as recommended by your health care provider. Do not drink alcohol if your health care provider tells you not to drink. If you drink alcohol: Limit how much you have to 0-2 drinks a day. Know how much alcohol is in your drink. In the [place] one drink equals one 12 oz bottle of beer (355 mL), one 5 oz glass of wine (148 mL), or one 1 oz glass of hard liquor (44 mL). Lifestyle Brush your teeth every morning and night with fluoride toothpaste. Floss one time each day. Exercise for at least 30 minutes 5 or more days each week. Do not use any products that contain nicotine or tobacco. These products include cigarettes, chewing tobacco, and vaping devices, such as e-cigarettes. If you need help quitting, ask your health care provider. Do not use drugs. If you are sexually active, practice safe sex. Use a condom or other form of protection to prevent STIs. Find healthy ways to manage stress, such as: Meditation, yoga, or listening to music. Journaling. Talking to a trusted person. Spending time with friends and family. Minimize exposure to UV radiation to reduce your risk of skin  cancer. Safety Always wear your seat belt while driving or riding in a vehicle. Do not drive: If you have been drinking alcohol. Do not ride with someone who has been drinking. If you have been using any mind-altering substances or drugs. While texting. When you are tired or distracted. Wear a helmet and other protective equipment during sports activities. If you have firearms in your house, make sure you follow all gun safety procedures. Seek help if you have been physically or sexually abused. What's next? Go to your health care provider once a year for an annual wellness visit. Ask your health care provider how often you should have your eyes and teeth checked. Stay up to date on all vaccines. This information is not intended to replace advice given to you by your health care provider. Make sure you discuss any questions you have with your health care provider. Document Revised: 09/23/2020 Document Reviewed: 09/23/2020 Elsevier Patient Education  2024 [employer].

## 2024-01-29 NOTE — Assessment & Plan Note (Signed)
 Stable on Pepcid 10 mg twice daily.

## 2024-01-29 NOTE — Assessment & Plan Note (Signed)
 Check uric acid level.  Doing well without any recent flares.  He is on allopurinol  150 mg daily.

## 2024-01-29 NOTE — Assessment & Plan Note (Signed)
 This has been an ongoing issue for many years but seems to have worsened recently.  He is additionally having more referred pain into his hips and the legs now and is also having pain in bilateral shoulders.  Will place referral to sports medicine for further evaluation.

## 2024-01-29 NOTE — Progress Notes (Signed)
 Chief Complaint:  Daniel Gilbert is a 38 y.o. male who presents today for his annual comprehensive physical exam.    Assessment/Plan:  Chronic Problems Addressed Today: Dyslipidemia Check lipids.  He is on Crestor  2.5 mg daily  Gout Check uric acid level.  Doing well without any recent flares.  He is on allopurinol  150 mg daily.    Chronic low back pain This has been an ongoing issue for many years but seems to have worsened recently.  He is additionally having more referred pain into his hips and the legs now and is also having pain in bilateral shoulders.  Will place referral to sports medicine for further evaluation.  Hyperglycemia Check A1c with labs.  Arthralgia Patient with multifocal joint and back pain.  We are referring to sports medicine as above.  GERD (gastroesophageal reflux disease) Stable on Pepcid 10 mg twice daily.  Preventative Healthcare: Flu shot given today.  Check labs.  Patient Counseling(The following topics were reviewed and/or handout was given):  -Nutrition: Stressed importance of moderation in sodium/caffeine intake, saturated fat and cholesterol, caloric balance, sufficient intake of fresh fruits, vegetables, and fiber.  -Stressed the importance of regular exercise.   -Substance Abuse: Discussed cessation/primary prevention of tobacco, alcohol, or other drug use; driving or other dangerous activities under the influence; availability of treatment for abuse.   -Injury prevention: Discussed safety belts, safety helmets, smoke detector, smoking near bedding or upholstery.   -Sexuality: Discussed sexually transmitted diseases, partner selection, use of condoms, avoidance of unintended pregnancy and contraceptive alternatives.   -Dental health: Discussed importance of regular tooth brushing, flossing, and dental visits.  -Health maintenance and immunizations reviewed. Please refer to Health maintenance section.  Return to care in 1 year for next  preventative visit.     Subjective:  HPI:  He has no acute complaints today. Patient is here today for his annual physical.  See assessment / plan for status of chronic conditions.  Discussed the use of AI scribe software for clinical note transcription with the patient, who gave verbal consent to proceed.  History of Present Illness Daniel Gilbert is a 38 year old male who presents for an annual physical exam.  He has ongoing back pain that has persisted for several years following surgery 4-6 years ago. He reports ongoing back pain for several years following surgery 4-6 years ago, and recently has experienced pain in his hip and groin, particularly after playing soccer, which he has stopped due to the pain.  He experiences shoulder pain, suspecting 'micro tears,' which has been exacerbated by recent physical activities such as moving house. He is attempting to increase his physical activity, including weight training, but is limited by his back and shoulder pain.  His diet has improved, although he struggles with maintaining healthy eating habits due to frequent dining out for work. He has lost a couple of pounds since last year and is trying to consume more fruits, vegetables, and water. He takes a green vegetable powder mix in the mornings.  He is currently taking 300 mg of allopurinol , half a pill daily, and 5 mg of Crestor , half a pill daily, although he is inconsistent with the latter. He is interested in monitoring his cholesterol levels through blood work.  No significant changes in vision or hearing, although he feels his ears get clogged easily. He has not used any ear cleaning tools yet.     Lifestyle Diet: None specific.  Exercise: Limited due to orthopedic pain  01/29/2024    8:16 AM  Depression screen PHQ 2/9  Decreased Interest 0  Down, Depressed, Hopeless 0  PHQ - 2 Score 0    Health Maintenance Due  Topic Date Due   Influenza Vaccine  11/10/2023      ROS: Per HPI, otherwise a complete review of systems was negative.   PMH:  The following were reviewed and entered/updated in epic: Past Medical History:  Diagnosis Date   Hyperlipidemia    Patient Active Problem List   Diagnosis Date Noted   Allergic rhinitis 01/26/2023   Arthralgia 01/24/2022   GERD (gastroesophageal reflux disease) 11/25/2019   Hyperglycemia 11/25/2019   Chronic low back pain 12/04/2017   Gout 05/29/2017   Dyslipidemia 03/07/2014   Past Surgical History:  Procedure Laterality Date   CLAVICLE SURGERY Right    ROTATOR CUFF REPAIR Right     Family History  Problem Relation Age of Onset   Depression Mother    Heart attack Maternal Grandfather    Hyperlipidemia Maternal Grandfather    Stroke Maternal Grandfather    Alcohol abuse Paternal Grandfather    Cancer Paternal Grandfather     Medications- reviewed and updated Current Outpatient Medications  Medication Sig Dispense Refill   allopurinol  (ZYLOPRIM ) 300 MG tablet Take 1 tablet (300 mg total) by mouth daily. 90 tablet 1   famotidine (PEPCID) 10 MG tablet Take 10 mg by mouth 2 (two) times daily.     rosuvastatin  (CRESTOR ) 5 MG tablet TAKE 1 TABLET (5 MG TOTAL) BY MOUTH DAILY. 90 tablet 1   No current facility-administered medications for this visit.    Allergies-reviewed and updated No Known Allergies  Social History   Socioeconomic History   Marital status: Married    Spouse name: Not on file   Number of children: 1   Years of education: Not on file   Highest education level: Not on file  Occupational History   Occupation: Production designer, theatre/television/film  Tobacco Use   Smoking status: Never   Smokeless tobacco: Never  Vaping Use   Vaping status: Never Used  Substance and Sexual Activity   Alcohol use: Yes   Drug use: No   Sexual activity: Yes  Other Topics Concern   Not on file  Social History Narrative   Not on file   Social Drivers of Health   Financial Resource Strain: Not on file  Food  Insecurity: Not on file  Transportation Needs: Not on file  Physical Activity: Not on file  Stress: Not on file  Social Connections: Not on file        Objective:  Physical Exam: BP 120/88   Pulse 69   Temp (!) 97.5 F (36.4 C) (Temporal)   Ht 5' 11 (1.803 m)   Wt 186 lb 3.2 oz (84.5 kg)   SpO2 100%   BMI 25.97 kg/m   Body mass index is 25.97 kg/m. Wt Readings from Last 3 Encounters:  01/29/24 186 lb 3.2 oz (84.5 kg)  01/26/23 188 lb (85.3 kg)  04/13/22 189 lb 6 oz (85.9 kg)   Gen: NAD, resting comfortably HEENT: TMs normal bilaterally. OP clear. No thyromegaly noted.  CV: RRR with no murmurs appreciated Pulm: NWOB, CTAB with no crackles, wheezes, or rhonchi GI: Normal bowel sounds present. Soft, Nontender, Nondistended. MSK: no edema, cyanosis, or clubbing noted Skin: warm, dry Neuro: CN2-12 grossly intact. Strength 5/5 in upper and lower extremities. Reflexes symmetric and intact bilaterally.  Psych: Normal affect and thought content  Worth HERO. Kennyth, MD 01/29/2024 8:38 AM

## 2024-01-29 NOTE — Assessment & Plan Note (Signed)
 Check lipids.  He is on Crestor  2.5 mg daily

## 2024-01-29 NOTE — Assessment & Plan Note (Signed)
Check A1c with labs. 

## 2024-01-30 ENCOUNTER — Ambulatory Visit: Payer: Self-pay | Admitting: Family Medicine

## 2024-01-30 NOTE — Progress Notes (Signed)
 His cholesterol is mildly elevated but better than last year.  He can continue with his current dose of Crestor  and we can recheck again in a year or so.  His A1c is mildly elevated at 5.7 but this is similar to last year.  The rest of his labs are all at goal.  Do not need to make any other adjustments to treatment plan at this time.  He should continue to work on diet and exercise and we can recheck again in a year or so.

## 2024-02-04 ENCOUNTER — Other Ambulatory Visit: Payer: Self-pay | Admitting: Family Medicine

## 2024-02-12 ENCOUNTER — Encounter: Payer: Self-pay | Admitting: Radiology

## 2024-02-13 ENCOUNTER — Ambulatory Visit: Admitting: Family Medicine

## 2024-02-13 ENCOUNTER — Ambulatory Visit (INDEPENDENT_AMBULATORY_CARE_PROVIDER_SITE_OTHER)

## 2024-02-13 ENCOUNTER — Encounter: Payer: Self-pay | Admitting: Family Medicine

## 2024-02-13 VITALS — BP 112/60 | HR 86 | Ht 71.0 in | Wt 185.8 lb

## 2024-02-13 DIAGNOSIS — M25551 Pain in right hip: Secondary | ICD-10-CM | POA: Diagnosis not present

## 2024-02-13 DIAGNOSIS — M25511 Pain in right shoulder: Secondary | ICD-10-CM | POA: Diagnosis not present

## 2024-02-13 DIAGNOSIS — M791 Myalgia, unspecified site: Secondary | ICD-10-CM | POA: Diagnosis not present

## 2024-02-13 DIAGNOSIS — M2550 Pain in unspecified joint: Secondary | ICD-10-CM | POA: Diagnosis not present

## 2024-02-13 DIAGNOSIS — G8929 Other chronic pain: Secondary | ICD-10-CM

## 2024-02-13 DIAGNOSIS — M25512 Pain in left shoulder: Secondary | ICD-10-CM

## 2024-02-13 LAB — SEDIMENTATION RATE: Sed Rate: 5 mm/h (ref 0–15)

## 2024-02-13 NOTE — Progress Notes (Signed)
 I, Daniel Gilbert am a scribe for Dr. Artist Lloyd, MD.  Daniel Gilbert is a 38 y.o. male who presents to [facility] at [facility] today for LBP x many years, worsening recently about 5 to 6 weeks ago. Pt locates pain to right hip and hip flexor. Plays soccer but it causes so much pain. It gets so bad that he can't walk for a week and then he can return to playing.   Both shoulders are in pain. He thinks there might be labrum tears.   Radiating pain: yes LE numbness/tingling: yes tingling LE weakness: no Aggravates: lifting heavy things, sleeping on right side Treatments tried: magnesium cream, Advil or Ibuprofen   Pertinent review of systems: No fevers or chills no fevers or chills  Relevant historical information: Back surgery (5 to 6 years ago), Disc problem  Exam:  BP 112/60   Pulse 86   Ht 5' 11 (1.803 m)   Wt 185 lb 12.8 oz (84.3 kg)   SpO2 96%   BMI 25.91 kg/m  General: Well Developed, well nourished, and in no acute distress.   MSK: Right hip decreased range of motion.  Limited flexion and rotation.  Strength is intact.    Lab and Radiology Results  X-ray images L-spine and right hip obtained today personally and independently interpreted.    L-spine: Mild degenerative changes L5-S1 otherwise relatively benign appearing.  Right hip: Significant hip dysplasia with degenerative changes right hip.  Mild to moderate changes left hip.  No acute fractures.  Await formal radiology review  Assessment and Plan: 37 y.o. male with right hip pain and lack of range of motion due to hip dysplasia.  Will discuss case with orthopedic surgeon colleague of mine.  In the meantime additionally we will proceed with rheumatologic workup and trial physical therapy.  If not better consider surgery options.  Additionally patient has shoulder pain as well.  Plan for physical therapy and rheumatologic workup as above.  PDMP not reviewed this  encounter. Orders Placed This Encounter  Procedures   DG Lumbar Spine 2-3 Views    Standing Status:   Future    Number of Occurrences:   1    Expiration Date:   02/12/2025    Reason for Exam (SYMPTOM  OR DIAGNOSIS REQUIRED):   back pain    Preferred imaging location?:   [facility]   DG HIP UNILAT W OR W/O PELVIS 2-3 VIEWS RIGHT    Standing Status:   Future    Number of Occurrences:   1    Expiration Date:   03/14/2024    Reason for Exam (SYMPTOM  OR DIAGNOSIS REQUIRED):   right hip pain    Preferred imaging location?:   [place] [place] [facility]   ANA    Standing Status:   Future    Number of Occurrences:   1    Expiration Date:   02/12/2025   Cyclic citrul peptide antibody, IgG    Standing Status:   Future    Number of Occurrences:   1    Expiration Date:   02/12/2025   HLA-B27 antigen    Polyarthalgia    Standing Status:   Future    Number of Occurrences:   1    Expiration Date:   02/12/2025   Sedimentation rate    Standing Status:   Future    Number of Occurrences:   1    Expiration Date:   02/12/2025   Rheumatoid  factor    Standing Status:   Future    Number of Occurrences:   1    Expiration Date:   02/12/2025   Ambulatory referral to Physical Therapy    Referral Priority:   Routine    Referral Type:   Physical Medicine    Referral Reason:   Specialty Services Required    Requested Specialty:   Physical Therapy    Number of Visits Requested:   1   No orders of the defined types were placed in this encounter.    Discussed warning signs or symptoms. Please see discharge instructions. Patient expresses understanding.   The above documentation has been reviewed and is accurate and complete Artist Lloyd, M.D.

## 2024-02-13 NOTE — Patient Instructions (Addendum)
 Thank you for coming in today.   Please get an Xray and stop by lab today before you leave   I've referred you to Physical Therapy.  Let us  know if you don't hear from them in one week.   Check back in 8 weeks

## 2024-02-14 ENCOUNTER — Encounter: Payer: Self-pay | Admitting: Family Medicine

## 2024-02-15 LAB — CYCLIC CITRUL PEPTIDE ANTIBODY, IGG: Cyclic Citrullin Peptide Ab: <16 U

## 2024-02-15 LAB — RHEUMATOID FACTOR: Rheumatoid fact SerPl-aCnc: <10 [IU]/mL (ref <14)

## 2024-02-15 LAB — HLA-B27 ANTIGEN: HLA-B27 Antigen: NEGATIVE

## 2024-02-15 LAB — ANA: Anti Nuclear Antibody (ANA): NEGATIVE

## 2024-02-16 ENCOUNTER — Ambulatory Visit: Payer: Self-pay | Admitting: Family Medicine

## 2024-02-16 NOTE — Progress Notes (Signed)
 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  And mild arthritis left hip

## 2024-02-16 NOTE — Progress Notes (Signed)
 Rheumatology labs look okay.  The source of your hip pain is pretty bad hip arthritis that may require hip replacement.

## 2024-02-16 NOTE — Progress Notes (Signed)
 Low back x-ray shows mild arthritis

## 2024-02-19 NOTE — Telephone Encounter (Signed)
 Patient called back. He saw the results in MyChart but would like to talk to someone about them as well.

## 2024-02-20 ENCOUNTER — Telehealth: Payer: Self-pay

## 2024-02-20 NOTE — Telephone Encounter (Signed)
 Daniel Gilbert Daniel Gilbert [facility]   02/19/24 12:24 PM Note Patient called back. He saw the results in MyChart but would like to talk to someone about them as well.      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  And mild arthritis left hip      Daniel Artist RAMAN, MD to [facility]    02/16/24  6:54 AM Note     Rheumatology labs look okay.   The source of your hip pain is pretty bad hip arthritis that may require hip replacement.

## 2024-02-20 NOTE — Telephone Encounter (Signed)
Called pt, left VM to call the office.  

## 2024-02-23 NOTE — Telephone Encounter (Signed)
 Called pt at [phone], left VM to call the office.

## 2024-02-26 NOTE — Telephone Encounter (Signed)
 Pt returned call, did not seem to have further questions and has scheduled physical therapy.

## 2024-03-04 ENCOUNTER — Ambulatory Visit: Admitting: Physical Therapy

## 2024-03-04 DIAGNOSIS — M5459 Other low back pain: Secondary | ICD-10-CM

## 2024-03-04 DIAGNOSIS — G8929 Other chronic pain: Secondary | ICD-10-CM | POA: Diagnosis not present

## 2024-03-04 DIAGNOSIS — M25551 Pain in right hip: Secondary | ICD-10-CM | POA: Diagnosis not present

## 2024-03-04 DIAGNOSIS — M25511 Pain in right shoulder: Secondary | ICD-10-CM | POA: Diagnosis not present

## 2024-03-04 DIAGNOSIS — M25512 Pain in left shoulder: Secondary | ICD-10-CM

## 2024-03-04 NOTE — Therapy (Signed)
 OUTPATIENT PHYSICAL THERAPY LOWER EXTREMITY EVALUATION   Patient Name: Daniel Gilbert MRN: [id number] DOB:Aug 16, 1985, 38 y.o., male Today's Date: 03/04/2024  END OF SESSION:  PT End of Session - 03/06/24 1155     Visit Number 1    Number of Visits 16    Date for Recertification  04/29/24    Authorization Type [employer]    PT Start Time 1350    PT Stop Time 1430    PT Time Calculation (min) 40 min    Activity Tolerance Patient tolerated treatment well    Behavior During Therapy [facility] for tasks assessed/performed          Past Medical History:  Diagnosis Date   Hyperlipidemia    Past Surgical History:  Procedure Laterality Date   CLAVICLE SURGERY Right    ROTATOR CUFF REPAIR Right    Patient Active Problem List   Diagnosis Date Noted   Allergic rhinitis 01/26/2023   Arthralgia 01/24/2022   GERD (gastroesophageal reflux disease) 11/25/2019   Hyperglycemia 11/25/2019   Chronic low back pain 12/04/2017   Gout 05/29/2017   Dyslipidemia 03/07/2014     PCP: Worth Kitty  REFERRING PROVIDER: Artist Lloyd   REFERRING DIAG: Pain of both shoulder, R hip pain   THERAPY DIAG:  Right hip pain  Other low back pain  Chronic pain of both shoulders  Rationale for Evaluation and Treatment: Rehabilitation  ONSET DATE:    SUBJECTIVE:   SUBJECTIVE STATEMENT: Pt has several pain complaints/locations.   Plays soccer 1x/wk league- stopped 3 mo ago due to pain  Previous Back surgery- dahari brooks.  6 yrs ago. Discectomy.  Did have improved leg pain after surgery- pin was on L side and down leg. Did some rehab, but states likely not enough.  Now states Pain in center of back. Also has ongoing R hip pain, states hip problems for a very long time. Notes most pain with running and soccer.  Also has bil shoulder pain. R RTC  repair  2004? Dislocated 3 times prior to that.  L shoulder- 1.5 yr ago, sore.  Pain Sleeping,  Pain with reaching out to side Posterior shoulder pain,  waking up in AM from sleeping on it.  Work: Engineer, [employer]- now managing , less lifting.  Sitting a lot at work, and driving. Some standing and walking.  Tries to walk, run, lift a bit, but less consistent due to pain.  Also travels for work.     PERTINENT HISTORY: R rotator cuff repair, R clavicle surgery, Back surgery (6 yr ago),     PAIN:  Are you having pain? Yes: NPRS scale: 4-5 /10 Pain location: R hip , posterior, sometimes into anterior hip Pain description: sore, achey Aggravating factors: soccer, running Relieving factors: none stated   Are you having pain? Yes: NPRS scale: 4-5/ 10 Pain location: Back pain  Pain description: tight, sore,  Aggravating factors: standing up straight  Relieving factors: none stated   Are you having pain? Yes: NPRS scale: 5-6/10 Pain location: L Shoulder Pain description: sore  Aggravating factors: Reaching up, out to side  Relieving factors: rest   Are you having pain? Yes: NPRS scale: 3/10 Pain location: R Shoulder Pain description: sore Aggravating factors: reaching up, out,  Relieving factors: rest   PRECAUTIONS: None  WEIGHT BEARING RESTRICTIONS: No  FALLS:  Has patient fallen in last 6 months? No   PLOF: Independent  PATIENT GOALS:  Decreased pain in back, hip, shoulders. Resume exercise with less pain  NEXT MD VISIT:   OBJECTIVE:   DIAGNOSTIC FINDINGS:   PATIENT SURVEYS:    COGNITION: Overall cognitive status: Within functional limits for tasks assessed     SENSATION: WFL  EDEMA:    POSTURE:    No Significant postural limitations  PALPATION: Pain in gr troch, into ITB, Tightnes/pain into R glute,  Hypomobile R hip No pain to palpate lumbar spine    LOWER EXTREMITY ROM:  Lumbar: WFL- mild pain with extension in center of back Hips:  L: WFL,   R: mild limitation for flexion, mod for ER, IR.  Mod limitation for Ext.  Knees:   WFL  Shoulders: will test next visit   LOWER EXTREMITY  MMT: Shoulders: Will test next visit   MMT Left eval Right  eval  Hip flexion 4+ 4  Hip extension    Hip abduction 4+ 4  Hip adduction    Hip internal rotation    Hip external rotation  4  Knee flexion 5 5  Knee extension 5 5  Ankle dorsiflexion    Ankle plantarflexion    Ankle inversion    Ankle eversion     (Blank rows = not tested)  LOWER EXTREMITY SPECIAL TESTS:    GAIT:   TODAY'S TREATMENT:                                                                                                                              DATE:  SKTC- slight ER x 3;  LTR x 15, Hip ER fallouts x 15/slow ,  LAD R hip   PATIENT EDUCATION:  Education details: PT POC, Exam findings, HEP Person educated: Patient Education method: Explanation, Demonstration, Tactile cues, Verbal cues, and Handouts Education comprehension: verbalized understanding, returned demonstration, verbal cues required, tactile cues required, and needs further education   HOME EXERCISE PROGRAM: Access Code: [email or web address] URL: [email or web address] Date: 03/06/2024 Prepared by: Tinnie Don  Exercises - Supine Lower Trunk Rotation  - 1-2 x daily - 10 reps - 5 hold - Bent Knee Fallouts  - 1-2 x daily - 1 sets - 10 reps - Hooklying Single Knee to Chest Stretch  - 2 x daily - 3 reps - 30 hold  ASSESSMENT:  CLINICAL IMPRESSION: Patient presents with primary complaint of  pain in multiple locations. He has ongoing pain in bil shoulders, with previous surgery on R. Pain on L worse than R at this time. He has longstanding R hip pain, with joint stiffness present. He also has ongoing back pain with previous surgery several years ago. He has difficulty with most activity due to pain in back and hip. He has  decreased ability for full functional activities,sleeping,  work duties, and exercise due to pain in multiple locations. Pt will  benefit from skilled PT to improve deficits and pain and to return to PLOF.    OBJECTIVE IMPAIRMENTS: decreased activity tolerance, decreased mobility, decreased ROM, decreased strength, increased muscle spasms, impaired flexibility, impaired  UE functional use, improper body mechanics, and pain.   ACTIVITY LIMITATIONS: lifting, sitting, standing, squatting, sleeping, stairs, reach over head, hygiene/grooming, and locomotion level  PARTICIPATION LIMITATIONS: meal prep, cleaning, laundry, driving, shopping, community activity, occupation, and yard work  PERSONAL FACTORS: Past/current experiences, Time since onset of injury/illness/exacerbation, and 1 comorbidity: multiple pain locations  are also affecting patient's functional outcome.   REHAB POTENTIAL: Good  CLINICAL DECISION MAKING: Evolving/moderate complexity  EVALUATION COMPLEXITY: Moderate   GOALS: Goals reviewed with patient? Yes  SHORT TERM GOALS: Target date: 03/25/2024   Pt to be independent with initial HEP for back and hip.   Goal status: INITIAL  2.  Pt to be independent with initial HEP for shoulders.   Goal status: INITIAL    LONG TERM GOALS: Target date: 04/29/2024   Pt to be independent with final HEP   Goal status: INITIAL  2.  Pt to demo lumbar/thoracic rom to be [facility] and pain free   Goal status: INITIAL  3.  Pt to demo max ability for hip and core strength, to improve pain and function.   Goal status: INITIAL  4.  Pt to demo ability for bil shoulder arom to be [facility] and pain free, to improve ability for IADLs and work duties .  Goal status: INITIAL    PLAN:  PT FREQUENCY: 1-2x/week  PT DURATION: 8 weeks  PLANNED INTERVENTIONS: Therapeutic exercises, Therapeutic activity, Neuromuscular re-education, Patient/Family education, Self Care, Joint mobilization, Joint manipulation, Stair training, Orthotic/Fit training, DME instructions, Aquatic Therapy, Dry Needling, Electrical stimulation, Cryotherapy, Moist heat, Taping, Ultrasound, Ionotophoresis 4mg /ml Dexamethasone,  Manual therapy,  Vasopneumatic device, Traction, Spinal manipulation, Spinal mobilization,Balance training, Gait training,   PLAN FOR NEXT SESSION:  cat cow, fwd flexion, lumbar mobility , R hip mobility, core strength, bridge, dead bug. Plank?   Eval for shoulders-    Tinnie Don, PT, DPT 11:57 AM  03/06/24

## 2024-03-06 ENCOUNTER — Encounter: Payer: Self-pay | Admitting: Physical Therapy

## 2024-03-25 ENCOUNTER — Encounter: Admitting: Physical Therapy

## 2024-03-26 ENCOUNTER — Telehealth: Payer: Self-pay

## 2024-03-26 NOTE — Telephone Encounter (Signed)
 Copied from CRM #[id number]. Topic: Clinical - Medical Advice >> Mar 26, 2024  5:01 PM Nessti S wrote: Reason for CRM: pt called because he wants to be released from PT to find PT hours that works with his schedule.  [phone]

## 2024-03-27 ENCOUNTER — Telehealth: Payer: Self-pay | Admitting: *Deleted

## 2024-03-27 NOTE — Telephone Encounter (Signed)
 Copied from CRM #[id number]. Topic: General - Other >> Mar 27, 2024  9:55 AM Carlyon D wrote: Reason for CRM: Pt is calling in regards to wanting a discharge form to be discharged from PT with [facility].  Pt has an appt at 9 am with new physical therapist pt needs discharge form today . Please call pt back [phone] with any additional questions.  Please also reach out to pt once discharge form is completed   Please advise  [facility]

## 2024-03-27 NOTE — Telephone Encounter (Signed)
 Patient notified of Dr. Carroll message via MyChart

## 2024-03-27 NOTE — Telephone Encounter (Signed)
 Recommend he discuss this with sports medicine and his physical therapist

## 2024-03-27 NOTE — Telephone Encounter (Signed)
 Daniel Gilbert

## 2024-03-28 ENCOUNTER — Encounter: Admitting: Physical Therapy

## 2024-03-28 NOTE — Telephone Encounter (Signed)
 We can write a letter stating he should be release but I highly recommend this come from the sports medicine team as they are the ones that referred him.

## 2024-03-28 NOTE — Telephone Encounter (Signed)
 Please see MyChart message. Highly recommend he contact sports medicine.

## 2024-03-28 NOTE — Telephone Encounter (Signed)
 Patient notified of Dr. Carroll response and requested patient to notify the office on how he'd like to proceed.

## 2024-03-29 NOTE — Telephone Encounter (Signed)
 Ok to write letter stating he should be released from physical therapy.

## 2024-03-30 ENCOUNTER — Encounter: Payer: Self-pay | Admitting: Family Medicine

## 2024-03-30 DIAGNOSIS — M25551 Pain in right hip: Secondary | ICD-10-CM

## 2024-03-30 DIAGNOSIS — G8929 Other chronic pain: Secondary | ICD-10-CM

## 2024-04-01 ENCOUNTER — Encounter: Admitting: Physical Therapy

## 2024-04-01 NOTE — Telephone Encounter (Signed)
 Forwarding to Lauren to review.

## 2024-04-08 ENCOUNTER — Encounter: Admitting: Physical Therapy

## 2024-04-09 ENCOUNTER — Ambulatory Visit: Admitting: Family Medicine

## 2024-04-12 NOTE — Progress Notes (Signed)
"       ° °  Daniel Ileana Collet, PhD, LAT, ATC acting as a scribe for Artist Lloyd, MD.  Daniel Gilbert is a 39 y.o. male who presents to [facility] at [facility] today for f/u R hip and bilat shoulder pain. Pt was last seen by Dr. Lloyd on 02/13/24 and labs were obtained and he was referred to PT. Located later changed to [facility].  Today, pt reports PT has been good/helpful. Some soreness after tx. He's been going 2x wk. He notes cont'd limited flexibility w/ his R hip. Shoulders are still painful, but secondary concern. PT has only been working on his hip.  Dx testing: 02/13/24 R hip & L-spine XR & labs  Pertinent review of systems: No fevers or chills  Relevant historical information: Otherwise healthy.  Patient does have an established relationship with Dr. Sharl at [facility].   Exam:  BP 124/84   Pulse 75   Ht 5' 11 (1.803 m)   Wt 192 lb (87.1 kg)   SpO2 98%   BMI 26.78 kg/m  General: Well Developed, well nourished, and in no acute distress.   MSK: Right hip decreased range of motion.    Lab and Radiology Results No results found for this or any previous visit (from the past 72 hours). No results found.     Assessment and Plan: 39 y.o. 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  He is improving a bit with physical therapy but ultimately will require hip replacement before too long.  Happy to refer to orthopedic surgery when the time comes.  He has an established relationship already with Dr. Sharl who would be a good choice.  If his shoulders bother him enough to dedicate physical therapy to that happy to change PT referral location and plan to shoulder.   PDMP not reviewed this encounter. No orders of the defined types were placed in this encounter.  No orders of the defined types were placed in this encounter.    Discussed warning signs or symptoms. Please see discharge instructions. Patient expresses understanding.   The  above documentation has been reviewed and is accurate and complete Artist Lloyd, M.D.   "

## 2024-04-15 ENCOUNTER — Ambulatory Visit: Admitting: Family Medicine

## 2024-04-15 VITALS — BP 124/84 | HR 75 | Ht 71.0 in | Wt 192.0 lb

## 2024-04-15 DIAGNOSIS — G8929 Other chronic pain: Secondary | ICD-10-CM

## 2024-04-15 DIAGNOSIS — M25551 Pain in right hip: Secondary | ICD-10-CM

## 2024-04-15 DIAGNOSIS — M25512 Pain in left shoulder: Secondary | ICD-10-CM | POA: Diagnosis not present

## 2024-04-15 DIAGNOSIS — M25511 Pain in right shoulder: Secondary | ICD-10-CM

## 2024-04-15 NOTE — Patient Instructions (Addendum)
 Thank you for coming in today.   Continue physical therapy and home exercises  Let me know if you would like me to change the PT referral to focus on your shoulders.  Check back as needed

## 2025-01-31 ENCOUNTER — Encounter: Admitting: Family Medicine
